# Patient Record
Sex: Male | Born: 1940 | Race: White | Hispanic: No | Marital: Married | Smoking: Never smoker
Health system: Southern US, Community
[De-identification: ages and names within clinical notes are randomized; demographics above are authoritative.]

## PROBLEM LIST (undated history)

## (undated) DIAGNOSIS — M1990 Unspecified osteoarthritis, unspecified site: Secondary | ICD-10-CM

## (undated) DIAGNOSIS — Z8679 Personal history of other diseases of the circulatory system: Secondary | ICD-10-CM

## (undated) DIAGNOSIS — Z950 Presence of cardiac pacemaker: Secondary | ICD-10-CM

## (undated) DIAGNOSIS — S069X9A Unspecified intracranial injury with loss of consciousness of unspecified duration, initial encounter: Secondary | ICD-10-CM

## (undated) DIAGNOSIS — I10 Essential (primary) hypertension: Secondary | ICD-10-CM

## (undated) DIAGNOSIS — E7800 Pure hypercholesterolemia, unspecified: Secondary | ICD-10-CM

## (undated) HISTORY — PX: NOSE SURGERY: SHX723

## (undated) HISTORY — DX: Unspecified osteoarthritis, unspecified site: M19.90

## (undated) HISTORY — PX: BACK SURGERY: SHX140

---

## 1962-10-01 DIAGNOSIS — S069X9A Unspecified intracranial injury with loss of consciousness of unspecified duration, initial encounter: Secondary | ICD-10-CM

## 1962-10-01 DIAGNOSIS — S069XAA Unspecified intracranial injury with loss of consciousness status unknown, initial encounter: Secondary | ICD-10-CM

## 1962-10-01 HISTORY — DX: Unspecified intracranial injury with loss of consciousness status unknown, initial encounter: S06.9XAA

## 1962-10-01 HISTORY — DX: Unspecified intracranial injury with loss of consciousness of unspecified duration, initial encounter: S06.9X9A

## 2001-10-16 ENCOUNTER — Encounter: Admission: RE | Admit: 2001-10-16 | Discharge: 2002-01-14 | Payer: Self-pay | Admitting: Internal Medicine

## 2002-03-27 ENCOUNTER — Ambulatory Visit (HOSPITAL_COMMUNITY): Admission: RE | Admit: 2002-03-27 | Discharge: 2002-03-27 | Payer: Self-pay | Admitting: Internal Medicine

## 2003-01-04 ENCOUNTER — Ambulatory Visit (HOSPITAL_COMMUNITY): Admission: RE | Admit: 2003-01-04 | Discharge: 2003-01-04 | Payer: Self-pay | Admitting: Gastroenterology

## 2007-03-21 ENCOUNTER — Encounter: Admission: RE | Admit: 2007-03-21 | Discharge: 2007-03-21 | Payer: Self-pay | Admitting: Internal Medicine

## 2007-03-23 ENCOUNTER — Encounter: Admission: RE | Admit: 2007-03-23 | Discharge: 2007-03-23 | Payer: Self-pay | Admitting: Internal Medicine

## 2007-03-29 ENCOUNTER — Ambulatory Visit (HOSPITAL_COMMUNITY): Admission: RE | Admit: 2007-03-29 | Discharge: 2007-03-29 | Payer: Self-pay | Admitting: Neurosurgery

## 2007-04-01 ENCOUNTER — Emergency Department (HOSPITAL_COMMUNITY): Admission: EM | Admit: 2007-04-01 | Discharge: 2007-04-01 | Payer: Self-pay | Admitting: Emergency Medicine

## 2007-06-15 ENCOUNTER — Observation Stay (HOSPITAL_COMMUNITY): Admission: EM | Admit: 2007-06-15 | Discharge: 2007-06-15 | Payer: Self-pay | Admitting: Emergency Medicine

## 2007-06-15 ENCOUNTER — Ambulatory Visit: Payer: Self-pay | Admitting: *Deleted

## 2007-08-03 ENCOUNTER — Emergency Department (HOSPITAL_COMMUNITY): Admission: EM | Admit: 2007-08-03 | Discharge: 2007-08-03 | Payer: Self-pay | Admitting: Family Medicine

## 2009-07-06 ENCOUNTER — Encounter: Admission: RE | Admit: 2009-07-06 | Discharge: 2009-07-06 | Payer: Self-pay | Admitting: Orthopedic Surgery

## 2009-07-09 ENCOUNTER — Ambulatory Visit (HOSPITAL_COMMUNITY): Admission: RE | Admit: 2009-07-09 | Discharge: 2009-07-10 | Payer: Self-pay | Admitting: Neurosurgery

## 2010-03-16 ENCOUNTER — Emergency Department (HOSPITAL_COMMUNITY): Admission: EM | Admit: 2010-03-16 | Discharge: 2010-03-16 | Payer: Self-pay | Admitting: Emergency Medicine

## 2010-08-30 ENCOUNTER — Emergency Department (HOSPITAL_COMMUNITY)
Admission: EM | Admit: 2010-08-30 | Discharge: 2010-08-30 | Payer: Self-pay | Source: Home / Self Care | Admitting: Family Medicine

## 2010-10-22 ENCOUNTER — Encounter: Payer: Self-pay | Admitting: Internal Medicine

## 2011-01-04 LAB — CBC
MCHC: 34 g/dL (ref 30.0–36.0)
MCV: 94.2 fL (ref 78.0–100.0)
RBC: 4.57 MIL/uL (ref 4.22–5.81)
RDW: 13.2 % (ref 11.5–15.5)

## 2011-01-04 LAB — BASIC METABOLIC PANEL
BUN: 15 mg/dL (ref 6–23)
CO2: 26 mEq/L (ref 19–32)
Calcium: 9.2 mg/dL (ref 8.4–10.5)
Chloride: 105 mEq/L (ref 96–112)
Creatinine, Ser: 0.87 mg/dL (ref 0.4–1.5)
Glucose, Bld: 124 mg/dL — ABNORMAL HIGH (ref 70–99)

## 2011-02-13 NOTE — H&P (Signed)
NAMECHRISTION, Marvin Padilla NO.:  [id number]   MEDICAL RECORD NO.:  [id number]          PATIENT TYPE:  INP   LOCATION:  2019                         FACILITY:  [facility]   PHYSICIAN:  Marvin Amel, MD    DATE OF BIRTH:  Feb 19, 1941   DATE OF ADMISSION:  06/14/2007  DATE OF DISCHARGE:                              HISTORY & PHYSICAL   PRIMARY CARE PHYSICIAN:  Marvin Padilla, M.D.   CHIEF COMPLAINT:  Chest pressure.   HISTORY OF PRESENT ILLNESS:  The patient is a 70 year old white male  with past medical history notable for hypertension, hyperlipidemia, who  presents to the emergency department for further evaluation of chest  pressure.  The patient states his symptoms began approximately 8 p.m.  this evening after working in the yard.  He characterized his chest  pressure localized over his sternum.  He denies any radiation to the  arm, neck, jaw or back.  He does endorse associated diaphoresis;  however, denies any shortness of breath, dyspnea on exertion, or  palpitations.  The patient does note that he had an episode of  indigestion several days ago which was similar in nature; however, this  episode this evening was much more severe.  On arrival to the emergency  department, the patient was initiated on a nitroglycerin drip which did  resolve his symptoms.  His EKG revealed a left bundle branch block and  on comparison with prior studies this is unchanged.  He had 2 sets of  point-of-care biomarkers drawn which were negative.  At the time of my  initial evaluation, the patient was chest pain free and otherwise  without complaints.   PAST MEDICAL HISTORY:  1. Hypertension.  2. Hyperlipidemia.  3. Status post back surgery in June 2008 for herniated disk.   ALLERGIES:  No known drug allergies.   CURRENT MEDICATIONS:  1. HCTZ 25 mg daily.  2. Crestor 10 mg daily.   SOCIAL HISTORY:  The patient lives in [place] with his wife.  He  is a retired Engineer, maintenance.  He denies tobacco, alcohol or  illicit substance.   FAMILY HISTORY:  His mother died secondary to myocardial infarction and  heart failure at the age of 34.  His father died at the age of 3  secondary to CAD and heart failure.  He has 1 sister who passed away  from ovarian cancer.   REVIEW OF SYSTEMS:  As per HPI.  Otherwise, complete review of systems  is negative.   PHYSICAL EXAMINATION:  VITAL SIGNS:  Blood pressure is 117/63.  Heart  rate is 69.  O2 sats are 96% on room air.  GENERAL:  The patient is alert and oriented times 3, in no acute  distress, pleasantly conversant.  HEENT:  Normocephalic, atraumatic.  EOMI.  PERRL.  Nares pink.  OP clear  without erythema or exudate.  NECK:  Supple, full range of motion, no JVD.  There is no palpable  thyromegaly or lymphadenopathy.  His carotid upstrokes are equal and  symmetric bilaterally with no audible bruits.  CARDIOVASCULAR:  Normal S1  and S2 with no audible murmurs, rubs or  gallops.  His PMI is nondisplaced in the left midclavicular line.  His  peripheral pulse are 2+ symmetric bilaterally.  LUNGS:  Clear to auscultation bilaterally.  SKIN:  No rashes or lesions.  ABDOMEN:  Soft, nontender, nondistended.  Positive bowel sounds.  No  hepatosplenomegaly.  GU:  Normal male genitalia.  EXTREMITIES:  Reveal no clubbing, cyanosis or edema.  There is no rashes  or inflammation noted.  MUSCULOSKELETAL:  No joint deformity or effusions.  NEUROLOGIC:  Strength and sensation are grossly intact throughout.  Otherwise, exam is nonfocal.   Chest x-ray:  No acute cardiopulmonary process.   EKG:  Normal sinus rhythm with left bundle branch block.   LABS:  White blood cell count 15.4.  Hematocrit 43.7.  Platelet count  202.  Sodium 136.  Potassium 3.7.  Chloride 96.  CO2 is 33.  Glucose  138.  BUN is 10.  Creatinine 0.74.  Point-of-care cardiac biomarkers  negative times 3.   IMPRESSION:  1. Chest pain, rule out  myocardial infarction.  2. Left bundle branch block.  3. Hypertension.  4. Hyperlipidemia.  5. Status post back surgery for herniated disk.   PLAN:  From a cardiovascular standpoint, [name] is stable and his  symptoms have resolved.  We will plan to admit the patient to a  telemetry bed to rule out myocardial infarction.  We will cycle his  cardiac biomarkers.  On my examination, there is no evidence of heart  failure, and his chest x-ray reveals no evidence of pulmonary edema.  His EKG reveals left bundle branch block which makes it somewhat  difficult to interpret for ischemia; however, this is unchanged compared  with his prior studies.  His initial point-of-care biomarkers are  negative times 3.  His symptoms are concerning for angina; however, I do  feel that an acute coronary syndrome is unlikely.  His blood pressure  was elevated on arrival; however, this has come under excellent control  with the nitroglycerin drip at 10 mcg.  He takes hydrochlorothiazide at  home.  We will continue with this therapy throughout this  hospitalization.  We will wean to nitroglycerin drip off and provide  sublingual nitroglycerin as needed.  The patient will likely require an  additional antihypertensive to achieve his goal blood pressure.  He is  currently taking 10 mg of Crestor for hyperlipidemia.  We will check an  a.m. fasting lipid profile.  The patient will likely require additional  risk stratification which may include cardiac catheterization versus  noninvasive stress testing.  Of note, the patient did have a low-grade  temp on presentation, and his white blood cell count is 15.4.  There is  no evidence of infiltrate on his chest x-ray.  He currently is  experiencing no localizing symptoms of infection.      Marvin Amel, MD  Electronically Signed    SHG/MEDQ  D:  06/15/2007  T:  06/15/2007  Job:  [id number]

## 2011-02-13 NOTE — Discharge Summary (Signed)
NAMETKAI, SERFASS NO.:  [id number]   MEDICAL RECORD NO.:  [id number]          PATIENT TYPE:  INP   LOCATION:  2019                         FACILITY:  [facility]   PHYSICIAN:  Jake Bathe, MD      DATE OF BIRTH:  Aug 18, 1941   DATE OF ADMISSION:  06/14/2007  DATE OF DISCHARGE:                               DISCHARGE SUMMARY   PRIMARY CARE PHYSICIAN:  Dr. Elmore Guise   CARDIOLOGIST:  Dr. [facility]   FINAL DIAGNOSES:  1. Chest pain.  2. Hypertension.  3. Hyperlipidemia.  4. Back surgery.  5. Herniated disc in June of 2008.   PROCEDURES:  Chest x-ray within normal limits.   HOSPITAL COURSE:  Mr. 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  He denied any radiation.  Tried to go to sleep, however pain persisted  and; therefore, went to [facility] Emergency Department.   He has a longstanding left bundle-branch block which was picked up on an  insurance physical a few years ago.  He has been medically compliant  with his blood pressure meds.  His wife has had three back surgeries; he  has had one.   He was placed on a telemetry floor and cardiac enzymes were cycled all  of which were within normal limits.   VITAL SIGNS ON DISCHARGE:  Blood pressure 145/86, heart rate 64,  respiratory rate 18, temperature 99.4.   PHYSICAL EXAMINATION:  GENERAL:  Alert and oriented x3, no acute  distress.  CARDIOVASCULAR:  Regular rate and rhythm.  No murmurs, rubs or gallops.  LUNGS:  Clear to auscultation bilaterally.  ABDOMEN:  Soft, nontender.  Normoactive bowel sounds.  EXTREMITIES:  No clubbing, cyanosis or edema.  NEUROLOGICAL:  Nonfocal.   DATA:  EKG  demonstrates left bundle-branch block, sinus rhythm, rate 73.   BNP was 65.  Total cholesterol 163, LDL 87, HDL 59, triglycerides 86.  Chemistry:  Creatinine 0.85, blood glucose 138 and 163.  CBC:  Noteworthy for white blood cell count on arrival of 15.4 then decreasing  to 14.5.   DISCHARGE MEDICATIONS:  1. Aspirin 81 mg p.o. q.day.  2. Crestor 10 mg p.o. q.day.  3. Hydrochlorothiazide 25 mg p.o. q.day.  4. Colace 100 mg p.o. q.day.  5. Nitroglycerin 0.4 mg sublingual p.r.n.   FOLLOWUP:  We will schedule return visit with me and outpatient stress  test as soon as possible.  I have instructed him to return to the  emergency department or call 911 if any further worrisome symptoms  occur.   Keep in mind slightly elevated blood glucose, would monitor as  outpatient for possible diabetes.      Jake Bathe, MD  Electronically Signed     MCS/MEDQ  D:  06/15/2007  T:  06/15/2007  Job:  [id number]  cc:   Lance Muss, M.D.

## 2011-02-13 NOTE — Op Note (Signed)
[name], Marvin Padilla              ACCOUNT NO.:  [id number]   MEDICAL RECORD NO.:  [id number]          PATIENT TYPE:  AMB   LOCATION:  SDS                          FACILITY:  [facility]   PHYSICIAN:  Coletta Memos, M.D.     DATE OF BIRTH:  [birth year]   DATE OF PROCEDURE:  03/29/2007  DATE OF DISCHARGE:                               OPERATIVE REPORT   PREOPERATIVE DIAGNOSIS:  1. Right displaced disc L4-L5.  2. Right L4 and right L5 radiculopathy.   POSTOPERATIVE DIAGNOSIS:  1. Right displaced disc L4-L5.  2. Right L4 and right L5 radiculopathy.   PROCEDURE:  Right L4-L5 semi-hemilaminectomy and discectomy with  microdissection.   SURGEON:  Coletta Memos, M.D.   ASSISTANT:  Cristi Loron, M.D.   COMPLICATIONS:  None.   ANESTHESIA:  General endotracheal.   INDICATIONS:  [name] presented with significant pain in the  right lower extremity which he has had now for approximately three  weeks.  The pain is unrelenting.  He had an MRI which showed a large  fragment of disc which had migrated rostrally behind the body of L4 and  a large amount of disc at the disc space.  I, therefore, offered and he  agreed to undergo operative decompression.   OPERATIVE NOTE:  [name] was brought to the operating room,  intubated, and placed under a general anesthetic without difficulty.  He  was rolled prone onto a Wilson frame and all pressure points were  properly padded.  His back was prepped and he was draped in a sterile  fashion.  I infiltrated 10 mL 0.5% lidocaine with 1:200,000 epinephrine  into the lumbar region.  I opened the skin with a #10 blade.  I took  this down to the thoracolumbar fascia sharply.  I exposed the lamina and  was L4.  I was in the L4-L5 interlaminar space.  I then placed a self-  retaining retractor.  I used a high speed drill to perform a semi-  hemilaminectomy of L4 and used a curet to remove the ligamentum flavum  to expose the thecal sac and  disc space.  I retract the thecal sac  medially and then was able to appreciate what was disc material that had  come out of the disc space.  I removed that in a piecemeal fashion, but  removing in addition, very large pieces along with it.  Because the disc  space was so weak, I brought the microscope into the operative field to  aid with microdissection.  With Dr. Lovell Sheehan' assistance, we also opened  the disc space and removed disc material from there.  The disc was  extraordinarily degenerated, very soft, and clearly incompetent.  But I  did not do significant scraping of the endplates at this time.  The  nerve root was very  well decompressed, both the L4 and L5 roots.  I then irrigated the  wound.  With Dr. Lovell Sheehan' assistance, we closed in layered fashion using  Vicryl sutures to reapproximate the thoracolumbar fascia and  subcuticular layers.  Dermabond was used for a sterile  dressing.           ______________________________  Coletta Memos, M.D.     KC/MEDQ  D:  03/29/2007  T:  03/29/2007  Job:  [id number]

## 2011-02-16 NOTE — Op Note (Signed)
   NAME:  Marvin Padilla, Marvin Padilla                        ACCOUNT NO.:  [id number]   MEDICAL RECORD NO.:  [id number]                   PATIENT TYPE:  AMB   LOCATION:  ENDO                                 FACILITY:  [facility]   PHYSICIAN:  Danise Edge, M.D.                DATE OF BIRTH:  04-Aug-1941   DATE OF PROCEDURE:  01/04/2003  DATE OF DISCHARGE:                                 OPERATIVE REPORT   PROCEDURE:  Colonoscopy.   INDICATIONS FOR PROCEDURE:  Marvin Padilla is a 70 year old male born  1941/05/28. Marvin Padilla underwent his health maintenance flexible  proctosigmoidoscopy performed by Dr. Elmore Guise; at approximately 10 cm from  the anal verge, a small colon polyp was encountered.   ENDOSCOPIST:  Charolett Bumpers, M.D.   PREMEDICATION:  Versed 7.5 mg, Demerol 50 mg .   ENDOSCOPE:  Olympus adult colonoscope.   DESCRIPTION OF PROCEDURE:  After obtaining informed consent, Marvin Padilla was  placed in the left lateral decubitus position. I administered intravenous  Demerol and intravenous Versed to achieve conscious sedation for the  procedure. The patient's blood pressure, oxygen saturation and cardiac  rhythm were monitored throughout the procedure and documented in the medical  record.   Anal inspection was normal. Digital rectal exam was normal. The Olympus  adult colonoscope was introduced into the rectum and easily advanced to the  cecum. A normal appearing ileocecal valve was intubated and the distal ileum  inspected.  Colonic preparation for the exam today was excellent.   RECTUM:  Normal.  Retroflexed view of the distal rectum normal. Close  inspection does not reveal any rectal polyps by exam today.   SIGMOID COLON AND DESCENDING COLON:  Normal.   SPLENIC FLEXURE:  Normal.   TRANSVERSE COLON:  Normal.   HEPATIC FLEXURE:  Normal.   ASCENDING COLON:  Normal.   CECUM AND ILEOCECAL VALVE:  Normal.   DISTAL ILEUM:  Normal.    ASSESSMENT:  Normal  proctocolonoscopy to the cecum with distal ileal  inspection. No endoscopic evidence for the presence of colorectal neoplasia.                                               Danise Edge, M.D.    MJ/MEDQ  D:  01/04/2003  T:  01/04/2003  Job:  [id number]   cc:   Erskine Speed, M.D.  [address]  [place]  [place] [address]  Fax: [phone]

## 2011-04-20 ENCOUNTER — Inpatient Hospital Stay (INDEPENDENT_AMBULATORY_CARE_PROVIDER_SITE_OTHER)
Admission: RE | Admit: 2011-04-20 | Discharge: 2011-04-20 | Disposition: A | Discharge status: Home / Self Care | Payer: Medicare Other | Source: Ambulatory Visit | Attending: Family Medicine | Admitting: Family Medicine

## 2011-04-20 DIAGNOSIS — T148 Other injury of unspecified body region: Secondary | ICD-10-CM

## 2011-07-13 LAB — BASIC METABOLIC PANEL
BUN: 11
CO2: 25
CO2: 33 — ABNORMAL HIGH
Calcium: 8.7
Chloride: 97
Creatinine, Ser: 0.74
Creatinine, Ser: 0.85
GFR calc Af Amer: >60
GFR calc non Af Amer: >60
Glucose, Bld: 138 — ABNORMAL HIGH
Glucose, Bld: 163 — ABNORMAL HIGH
Potassium: 4
Sodium: 136

## 2011-07-13 LAB — CBC
HCT: 40.6
Hemoglobin: 14.9
MCHC: 34
MCHC: 34
MCV: 93.2
Platelets: 200
RBC: 4.69
RDW: 13
RDW: 13.1

## 2011-07-13 LAB — B-NATRIURETIC PEPTIDE (CONVERTED LAB): Pro B Natriuretic peptide (BNP): 65

## 2011-07-13 LAB — CK TOTAL AND CKMB (NOT AT ARMC): Relative Index: INVALID

## 2011-07-13 LAB — POCT CARDIAC MARKERS
CKMB, poc: 1.1
Myoglobin, poc: 85.8
Myoglobin, poc: 99.8
Operator id: 192351
Operator id: 192351
Troponin i, poc: <0.05

## 2011-07-13 LAB — LIPID PANEL
Cholesterol: 163
HDL: 59
LDL Cholesterol: 87
Total CHOL/HDL Ratio: 2.8
Triglycerides: 86

## 2011-07-18 LAB — BASIC METABOLIC PANEL
CO2: 30
Calcium: 9.2
GFR calc Af Amer: >60
Glucose, Bld: 105 — ABNORMAL HIGH
Potassium: 3.8
Sodium: 137

## 2011-07-18 LAB — CBC
HCT: 44.5
Hemoglobin: 15.1
MCHC: 33.9
RBC: 5.01
RDW: 13.4

## 2013-10-22 ENCOUNTER — Emergency Department (HOSPITAL_COMMUNITY)
Admission: EM | Admit: 2013-10-22 | Discharge: 2013-10-22 | Disposition: A | Discharge status: Home / Self Care | Payer: Medicare Other | Source: Home / Self Care

## 2013-10-22 ENCOUNTER — Encounter (HOSPITAL_COMMUNITY): Payer: Self-pay | Admitting: Emergency Medicine

## 2013-10-22 DIAGNOSIS — R059 Cough, unspecified: Secondary | ICD-10-CM

## 2013-10-22 DIAGNOSIS — R0982 Postnasal drip: Secondary | ICD-10-CM

## 2013-10-22 DIAGNOSIS — R05 Cough: Secondary | ICD-10-CM

## 2013-10-22 HISTORY — DX: Essential (primary) hypertension: I10

## 2013-10-22 HISTORY — DX: Pure hypercholesterolemia, unspecified: E78.00

## 2013-10-22 NOTE — ED Provider Notes (Signed)
Medical screening examination/treatment/procedure(s) were performed by non-physician practitioner and as supervising physician I was immediately available for consultation/collaboration.  Kimberlyn Quiocho, M.D.   Adalid Beckmann C Demita Tobia, MD 10/22/13 1811 

## 2013-10-22 NOTE — ED Notes (Signed)
Congestion, chest congestion, non-productive cough, feels of rattling in chest, denies fever, no ear pain, no sore throat.  No nausea, no vomiting, no diarrhea

## 2013-10-22 NOTE — ED Provider Notes (Signed)
CSN: [id number]     Arrival date & time 10/22/13  1157 History   First MD Initiated Contact with Patient 10/22/13 1437     Chief Complaint  Patient presents with  . URI   (Consider location/radiation/quality/duration/timing/severity/associated sxs/prior Treatment) HPI Comments: Pleasant 73 year old male is complaining of chest congestion that is worse in the morning, a rattle in the upper mid chest, cough and PND for the past 2-3 days. Denies earache, fever, sore throat or shortness of breath.   Past Medical History  Diagnosis Date  . Hypertension   . High cholesterol    Past Surgical History  Procedure Laterality Date  . Back surgery    . Nose surgery     No family history on file. History  Substance Use Topics  . Smoking status: Not on file  . Smokeless tobacco: Not on file  . Alcohol Use: Not on file    Review of Systems  Constitutional: Negative for fever, diaphoresis, activity change and fatigue.  HENT: Positive for postnasal drip, rhinorrhea and trouble swallowing. Negative for ear pain, facial swelling and sore throat.   Eyes: Negative for pain, discharge and redness.  Respiratory: Positive for cough. Negative for chest tightness, shortness of breath and wheezing.   Cardiovascular: Negative.   Gastrointestinal: Negative.   Musculoskeletal: Negative.  Negative for neck pain and neck stiffness.  Neurological: Negative.     Allergies  Review of patient's allergies indicates no known allergies.  Home Medications   Current Outpatient Rx  Name  Route  Sig  Dispense  Refill  . chlorpheniramine-HYDROcodone (TUSSIONEX PENNKINETIC ER) 10-8 MG/5ML LQCR   Oral   Take 5 mLs by mouth.         Marland Kitchen. lisinopril-hydrochlorothiazide (PRINZIDE,ZESTORETIC) 20-25 MG per tablet   Oral   Take 1 tablet by mouth daily.         . Pseudoeph-Doxylamine-DM-APAP (NYQUIL PO)   Oral   Take by mouth.         Marland Kitchen. SIMVASTATIN PO   Oral   Take by mouth.          BP 153/83   Pulse 69  Temp(Src) 98.9 F (37.2 C) (Oral)  Resp 20  SpO2 98% Physical Exam  Nursing note and vitals reviewed. Constitutional: He is oriented to person, place, and time. He appears well-developed and well-nourished. No distress.  HENT:  Mouth/Throat: No oropharyngeal exudate.  Bilateral TMs are normal Oropharynx minimal erythema and clear PND.  Eyes: Conjunctivae and EOM are normal.  Neck: Normal range of motion. Neck supple.  Cardiovascular: Normal rate, regular rhythm and normal heart sounds.   Pulmonary/Chest: Effort normal and breath sounds normal. No respiratory distress. He has no wheezes. He has no rales.  No rattling in the chest as her no wheezes, crackles or other adventitious sounds with forced and deep expiration and cough.  Musculoskeletal: Normal range of motion. He exhibits no edema.  Lymphadenopathy:    He has no cervical adenopathy.  Neurological: He is alert and oriented to person, place, and time. He exhibits normal muscle tone. Coordination normal.  Skin: Skin is warm and dry. No rash noted.  Psychiatric: He has a normal mood and affect.    ED Course  Procedures (including critical care time) Labs Review Labs Reviewed - No data to display Imaging Review No results found.    MDM   1. PND (post-nasal drip)   2. Cough       Drink plenty of fluids stay well hydrated Allegra or  Claritin for drainage At nighttime for a stronger medication to inhibit drainage Chlor-Trimeton. For any worsening such as fever, shortness of breath recheck promptly.     Hayden Rasmussen, NP 10/22/13 1459  Hayden Rasmussen, NP 10/22/13 [phone]

## 2013-10-22 NOTE — Discharge Instructions (Signed)
Cough, Adult  A cough is a reflex that helps clear your throat and airways. It can help heal the body or may be a reaction to an irritated airway. A cough may only last 2 or 3 weeks (acute) or may last more than 8 weeks (chronic).  CAUSES Acute cough:  Viral or bacterial infections. Chronic cough:  Infections.  Allergies.  Asthma.  Post-nasal drip.  Smoking.  Heartburn or acid reflux.  Some medicines.  Chronic lung problems (COPD).  Cancer. SYMPTOMS   Cough.  Fever.  Chest pain.  Increased breathing rate.  High-pitched whistling sound when breathing (wheezing).  Colored mucus that you cough up (sputum). TREATMENT   A bacterial cough may be treated with antibiotic medicine.  A viral cough must run its course and will not respond to antibiotics.  Your caregiver may recommend other treatments if you have a chronic cough. HOME CARE INSTRUCTIONS   Only take over-the-counter or prescription medicines for pain, discomfort, or fever as directed by your caregiver. Use cough suppressants only as directed by your caregiver.  Use a cold steam vaporizer or humidifier in your bedroom or home to help loosen secretions.  Sleep in a semi-upright position if your cough is worse at night.  Rest as needed.  Stop smoking if you smoke. SEEK IMMEDIATE MEDICAL CARE IF:   You have pus in your sputum.  Your cough starts to worsen.  You cannot control your cough with suppressants and are losing sleep.  You begin coughing up blood.  You have difficulty breathing.  You develop pain which is getting worse or is uncontrolled with medicine.  You have a fever. MAKE SURE YOU:   Understand these instructions.  Will watch your condition.  Will get help right away if you are not doing well or get worse. Document Released: 03/16/2011 Document Revised: 12/10/2011 Document Reviewed: 03/16/2011 [facility] Patient Information 2014 [place], [place].  Sinusitis Sinusitis is  redness, soreness, and puffiness (inflammation) of the air pockets in the bones of your face (sinuses). The redness, soreness, and puffiness can cause air and mucus to get trapped in your sinuses. This can allow germs to grow and cause an infection. The most frequent cause of sinus congestion is upper respiratory infection (commonc cold) producing mucous that drains downward against the back of the throat and causes cough, soreness of throat and secretions. This drainage often collects in the throat and trachea (wind pipe) causing increased cough, often worse after lying down for a period of time. HOME CARE   Drink enough fluids to keep your pee (urine) clear or pale yellow.  Use a humidifier in your home.  Run a hot shower to create steam in the bathroom. Sit in the bathroom with the door closed. Breathe in the steam 3 4 times a day.  Put a warm, moist washcloth on your face 3 4 times a day, or as told by your doctor.  Use salt water sprays (saline sprays) to wet the thick fluid in your nose. This can help the sinuses drain.  Only take medicine as told by your doctor. GET HELP RIGHT AWAY IF:   Your pain gets worse.  You have very bad headaches.  You are sick to your stomach (nauseous).  You throw up (vomit).  You are very sleepy (drowsy) all the time.  Your face is puffy (swollen).  Your vision changes.  You have a stiff neck.  You have trouble breathing. MAKE SURE YOU:   Understand these instructions.  Will watch  your condition.  Will get help right away if you are not doing well or get worse. Document Released: 03/05/2008 Document Revised: 06/11/2012 Document Reviewed: 04/22/2012 [facility] Patient Information 2014 [place]  Upper Respiratory Infection, Adult Nondrowsy: allegra 180 mg or claritin 10 mg Drowsy: chlor trimeton 4 mg, a little stronger. An upper respiratory infection (URI) is also sometimes known as the common cold. The upper respiratory tract  includes the nose, sinuses, throat, trachea, and bronchi. Bronchi are the airways leading to the lungs. Most people improve within 1 week, but symptoms can last up to 2 weeks. A residual cough may last even longer.  CAUSES Many different viruses can infect the tissues lining the upper respiratory tract. The tissues become irritated and inflamed and often become very moist. Mucus production is also common. A cold is contagious. You can easily spread the virus to others by oral contact. This includes kissing, sharing a glass, coughing, or sneezing. Touching your mouth or nose and then touching a surface, which is then touched by another person, can also spread the virus. SYMPTOMS  Symptoms typically develop 1 to 3 days after you come in contact with a cold virus. Symptoms vary from person to person. They may include:  Runny nose.  Sneezing.  Nasal congestion.  Sinus irritation.  Sore throat.  Loss of voice (laryngitis).  Cough.  Fatigue.  Muscle aches.  Loss of appetite.  Headache.  Low-grade fever. DIAGNOSIS  You might diagnose your own cold based on familiar symptoms, since most people get a cold 2 to 3 times a year. Your caregiver can confirm this based on your exam. Most importantly, your caregiver can check that your symptoms are not due to another disease such as strep throat, sinusitis, pneumonia, asthma, or epiglottitis. Blood tests, throat tests, and X-rays are not necessary to diagnose a common cold, but they may sometimes be helpful in excluding other more serious diseases. Your caregiver will decide if any further tests are required. RISKS AND COMPLICATIONS  You may be at risk for a more severe case of the common cold if you smoke cigarettes, have chronic heart disease (such as heart failure) or lung disease (such as asthma), or if you have a weakened immune system. The very young and very old are also at risk for more serious infections. Bacterial sinusitis, middle ear  infections, and bacterial pneumonia can complicate the common cold. The common cold can worsen asthma and chronic obstructive pulmonary disease (COPD). Sometimes, these complications can require emergency medical care and may be life-threatening. PREVENTION  The best way to protect against getting a cold is to practice good hygiene. Avoid oral or hand contact with people with cold symptoms. Wash your hands often if contact occurs. There is no clear evidence that vitamin C, vitamin E, echinacea, or exercise reduces the chance of developing a cold. However, it is always recommended to get plenty of rest and practice good nutrition. TREATMENT  Treatment is directed at relieving symptoms. There is no cure. Antibiotics are not effective, because the infection is caused by a virus, not by bacteria. Treatment may include:  Increased fluid intake. Sports drinks offer valuable electrolytes, sugars, and fluids.  Breathing heated mist or steam (vaporizer or shower).  Eating chicken soup or other clear broths, and maintaining good nutrition.  Getting plenty of rest.  Using gargles or lozenges for comfort.  Controlling fevers with ibuprofen or acetaminophen as directed by your caregiver.  Increasing usage of your inhaler if you have asthma. Zinc  gel and zinc lozenges, taken in the first 24 hours of the common cold, can shorten the duration and lessen the severity of symptoms. Pain medicines may help with fever, muscle aches, and throat pain. A variety of non-prescription medicines are available to treat congestion and runny nose. Your caregiver can make recommendations and may suggest nasal or lung inhalers for other symptoms.  HOME CARE INSTRUCTIONS   Only take over-the-counter or prescription medicines for pain, discomfort, or fever as directed by your caregiver.  Use a warm mist humidifier or inhale steam from a shower to increase air moisture. This may keep secretions moist and make it easier to  breathe.  Drink enough water and fluids to keep your urine clear or pale yellow.  Rest as needed.  Return to work when your temperature has returned to normal or as your caregiver advises. You may need to stay home longer to avoid infecting others. You can also use a face mask and careful hand washing to prevent spread of the virus. SEEK MEDICAL CARE IF:   After the first few days, you feel you are getting worse rather than better.  You need your caregiver's advice about medicines to control symptoms.  You develop chills, worsening shortness of breath, or brown or red sputum. These may be signs of pneumonia.  You develop yellow or brown nasal discharge or pain in the face, especially when you bend forward. These may be signs of sinusitis.  You develop a fever, swollen neck glands, pain with swallowing, or white areas in the back of your throat. These may be signs of strep throat. SEEK IMMEDIATE MEDICAL CARE IF:   You have a fever.  You develop severe or persistent headache, ear pain, sinus pain, or chest pain.  You develop wheezing, a prolonged cough, cough up blood, or have a change in your usual mucus (if you have chronic lung disease).  You develop sore muscles or a stiff neck. Document Released: 03/13/2001 Document Revised: 12/10/2011 Document Reviewed: 01/19/2011 [facility] Patient Information 2014 [place], [place].

## 2014-11-09 DIAGNOSIS — L309 Dermatitis, unspecified: Secondary | ICD-10-CM | POA: Diagnosis not present

## 2015-02-07 DIAGNOSIS — L219 Seborrheic dermatitis, unspecified: Secondary | ICD-10-CM | POA: Diagnosis not present

## 2015-02-07 DIAGNOSIS — L570 Actinic keratosis: Secondary | ICD-10-CM | POA: Diagnosis not present

## 2015-03-09 DIAGNOSIS — H25813 Combined forms of age-related cataract, bilateral: Secondary | ICD-10-CM | POA: Diagnosis not present

## 2015-03-17 DIAGNOSIS — E780 Pure hypercholesterolemia: Secondary | ICD-10-CM | POA: Diagnosis not present

## 2015-03-17 DIAGNOSIS — D559 Anemia due to enzyme disorder, unspecified: Secondary | ICD-10-CM | POA: Diagnosis not present

## 2015-03-17 DIAGNOSIS — Z125 Encounter for screening for malignant neoplasm of prostate: Secondary | ICD-10-CM | POA: Diagnosis not present

## 2015-03-17 DIAGNOSIS — I10 Essential (primary) hypertension: Secondary | ICD-10-CM | POA: Diagnosis not present

## 2015-03-18 DIAGNOSIS — Z0000 Encounter for general adult medical examination without abnormal findings: Secondary | ICD-10-CM | POA: Diagnosis not present

## 2015-03-18 DIAGNOSIS — I10 Essential (primary) hypertension: Secondary | ICD-10-CM | POA: Diagnosis not present

## 2015-03-18 DIAGNOSIS — E780 Pure hypercholesterolemia: Secondary | ICD-10-CM | POA: Diagnosis not present

## 2015-03-29 DIAGNOSIS — M62830 Muscle spasm of back: Secondary | ICD-10-CM | POA: Diagnosis not present

## 2015-04-30 ENCOUNTER — Emergency Department (HOSPITAL_COMMUNITY): Payer: Medicare Other

## 2015-04-30 ENCOUNTER — Observation Stay (HOSPITAL_COMMUNITY): Payer: Medicare Other

## 2015-04-30 ENCOUNTER — Observation Stay (HOSPITAL_COMMUNITY)
Admission: EM | Admit: 2015-04-30 | Discharge: 2015-05-02 | Disposition: A | Discharge status: Home / Self Care | Payer: Medicare Other | Attending: Internal Medicine | Admitting: Internal Medicine

## 2015-04-30 ENCOUNTER — Encounter (HOSPITAL_COMMUNITY): Payer: Self-pay | Admitting: Emergency Medicine

## 2015-04-30 DIAGNOSIS — R55 Syncope and collapse: Principal | ICD-10-CM | POA: Diagnosis present

## 2015-04-30 DIAGNOSIS — I10 Essential (primary) hypertension: Secondary | ICD-10-CM | POA: Insufficient documentation

## 2015-04-30 DIAGNOSIS — R569 Unspecified convulsions: Secondary | ICD-10-CM | POA: Diagnosis not present

## 2015-04-30 DIAGNOSIS — I951 Orthostatic hypotension: Secondary | ICD-10-CM | POA: Diagnosis present

## 2015-04-30 DIAGNOSIS — I447 Left bundle-branch block, unspecified: Secondary | ICD-10-CM

## 2015-04-30 DIAGNOSIS — Z87820 Personal history of traumatic brain injury: Secondary | ICD-10-CM | POA: Diagnosis not present

## 2015-04-30 DIAGNOSIS — S069XAA Unspecified intracranial injury with loss of consciousness status unknown, initial encounter: Secondary | ICD-10-CM | POA: Diagnosis present

## 2015-04-30 DIAGNOSIS — E7800 Pure hypercholesterolemia, unspecified: Secondary | ICD-10-CM

## 2015-04-30 DIAGNOSIS — R001 Bradycardia, unspecified: Secondary | ICD-10-CM | POA: Insufficient documentation

## 2015-04-30 DIAGNOSIS — E785 Hyperlipidemia, unspecified: Secondary | ICD-10-CM | POA: Insufficient documentation

## 2015-04-30 DIAGNOSIS — R4020 Unspecified coma: Secondary | ICD-10-CM | POA: Diagnosis not present

## 2015-04-30 DIAGNOSIS — R5601 Complex febrile convulsions: Secondary | ICD-10-CM | POA: Diagnosis not present

## 2015-04-30 DIAGNOSIS — S069X9A Unspecified intracranial injury with loss of consciousness of unspecified duration, initial encounter: Secondary | ICD-10-CM | POA: Diagnosis present

## 2015-04-30 HISTORY — DX: Personal history of other diseases of the circulatory system: Z86.79

## 2015-04-30 HISTORY — DX: Unspecified intracranial injury with loss of consciousness of unspecified duration, initial encounter: S06.9X9A

## 2015-04-30 LAB — URINALYSIS, ROUTINE W REFLEX MICROSCOPIC
Bilirubin Urine: NEGATIVE
Glucose, UA: NEGATIVE mg/dL
HGB URINE DIPSTICK: NEGATIVE
Ketones, ur: NEGATIVE mg/dL
Leukocytes, UA: NEGATIVE
NITRITE: NEGATIVE
PH: 6 (ref 5.0–8.0)
PROTEIN: NEGATIVE mg/dL
SPECIFIC GRAVITY, URINE: 1.01 (ref 1.005–1.030)
UROBILINOGEN UA: 0.2 mg/dL (ref 0.0–1.0)

## 2015-04-30 LAB — CBC WITH DIFFERENTIAL/PLATELET
Basophils Absolute: 0.1 10*3/uL (ref 0.0–0.1)
Basophils Relative: 2 % — ABNORMAL HIGH (ref 0–1)
EOS ABS: 0.1 10*3/uL (ref 0.0–0.7)
EOS PCT: 4 % (ref 0–5)
HEMATOCRIT: 40.8 % (ref 39.0–52.0)
Hemoglobin: 14.2 g/dL (ref 13.0–17.0)
LYMPHS ABS: 1.8 10*3/uL (ref 0.7–4.0)
LYMPHS PCT: 43 % (ref 12–46)
MCH: 31.1 pg (ref 26.0–34.0)
MCHC: 34.8 g/dL (ref 30.0–36.0)
MCV: 89.5 fL (ref 78.0–100.0)
MONO ABS: 0.3 10*3/uL (ref 0.1–1.0)
Monocytes Relative: 7 % (ref 3–12)
Neutro Abs: 1.8 10*3/uL (ref 1.7–7.7)
Neutrophils Relative %: 44 % (ref 43–77)
PLATELETS: 157 10*3/uL (ref 150–400)
RBC: 4.56 MIL/uL (ref 4.22–5.81)
RDW: 12.8 % (ref 11.5–15.5)
WBC: 4 10*3/uL (ref 4.0–10.5)

## 2015-04-30 LAB — TROPONIN I

## 2015-04-30 LAB — BASIC METABOLIC PANEL
ANION GAP: 8 (ref 5–15)
BUN: 15 mg/dL (ref 6–20)
CHLORIDE: 105 mmol/L (ref 101–111)
CO2: 24 mmol/L (ref 22–32)
Calcium: 9.2 mg/dL (ref 8.9–10.3)
Creatinine, Ser: 0.8 mg/dL (ref 0.61–1.24)
Glucose, Bld: 132 mg/dL — ABNORMAL HIGH (ref 65–99)
POTASSIUM: 3.9 mmol/L (ref 3.5–5.1)
SODIUM: 137 mmol/L (ref 135–145)

## 2015-04-30 LAB — MAGNESIUM: Magnesium: 2.3 mg/dL (ref 1.7–2.4)

## 2015-04-30 LAB — I-STAT TROPONIN, ED: TROPONIN I, POC: 0 ng/mL (ref 0.00–0.08)

## 2015-04-30 LAB — TSH: TSH: 1.307 u[IU]/mL (ref 0.350–4.500)

## 2015-04-30 MED ORDER — SIMVASTATIN 40 MG PO TABS
40.0000 mg | ORAL_TABLET | Freq: Every day | ORAL | Status: DC
  Administered 2015-05-01 – 2015-05-02 (×2): 40 mg via ORAL
  Filled 2015-04-30 (×2): qty 1

## 2015-04-30 MED ORDER — ENOXAPARIN SODIUM 40 MG/0.4ML ~~LOC~~ SOLN
40.0000 mg | SUBCUTANEOUS | Status: DC
  Administered 2015-04-30 – 2015-05-02 (×3): 40 mg via SUBCUTANEOUS
  Filled 2015-04-30 (×3): qty 0.4

## 2015-04-30 MED ORDER — ONDANSETRON HCL 4 MG/2ML IJ SOLN: 4.0000 mg | Freq: Four times a day (QID) | INTRAMUSCULAR | Status: DC | PRN

## 2015-04-30 MED ORDER — SODIUM CHLORIDE 0.9 % IJ SOLN
3.0000 mL | Freq: Two times a day (BID) | INTRAMUSCULAR | Status: DC
  Administered 2015-04-30 (×2): 3 mL via INTRAVENOUS

## 2015-04-30 MED ORDER — ADULT MULTIVITAMIN W/MINERALS CH
1.0000 | ORAL_TABLET | Freq: Every day | ORAL | Status: DC
  Administered 2015-05-01 – 2015-05-02 (×2): 1 via ORAL
  Filled 2015-04-30 (×4): qty 1

## 2015-04-30 MED ORDER — SENNOSIDES-DOCUSATE SODIUM 8.6-50 MG PO TABS: 1.0000 | ORAL_TABLET | Freq: Every evening | ORAL | Status: DC | PRN

## 2015-04-30 MED ORDER — ACETAMINOPHEN 325 MG PO TABS
650.0000 mg | ORAL_TABLET | Freq: Four times a day (QID) | ORAL | Status: DC | PRN
  Administered 2015-04-30 – 2015-05-01 (×2): 650 mg via ORAL
  Filled 2015-04-30 (×2): qty 2

## 2015-04-30 MED ORDER — LISINOPRIL 20 MG PO TABS
20.0000 mg | ORAL_TABLET | Freq: Every day | ORAL | Status: DC
  Administered 2015-05-01: 20 mg via ORAL
  Filled 2015-04-30: qty 1

## 2015-04-30 MED ORDER — LISINOPRIL-HYDROCHLOROTHIAZIDE 20-25 MG PO TABS: 1.0000 | ORAL_TABLET | Freq: Every day | ORAL | Status: DC

## 2015-04-30 MED ORDER — ACETAMINOPHEN 650 MG RE SUPP
650.0000 mg | Freq: Four times a day (QID) | RECTAL | Status: DC | PRN
Start: 2015-04-30 — End: 2015-05-02

## 2015-04-30 MED ORDER — LORATADINE 10 MG PO TABS
10.0000 mg | ORAL_TABLET | Freq: Every day | ORAL | Status: DC
  Administered 2015-05-01 – 2015-05-02 (×2): 10 mg via ORAL
  Filled 2015-04-30 (×2): qty 1

## 2015-04-30 MED ORDER — HYDROCHLOROTHIAZIDE 25 MG PO TABS: 25.0000 mg | ORAL_TABLET | Freq: Every day | ORAL | Status: DC

## 2015-04-30 MED ORDER — SODIUM CHLORIDE 0.9 % IV BOLUS (SEPSIS)
500.0000 mL | Freq: Once | INTRAVENOUS | Status: AC
  Administered 2015-04-30: 500 mL via INTRAVENOUS

## 2015-04-30 MED ORDER — DOCUSATE SODIUM 100 MG PO CAPS
100.0000 mg | ORAL_CAPSULE | Freq: Every day | ORAL | Status: DC
  Administered 2015-05-01 – 2015-05-02 (×2): 100 mg via ORAL
  Filled 2015-04-30 (×3): qty 1

## 2015-04-30 MED ORDER — ONDANSETRON HCL 4 MG PO TABS
4.0000 mg | ORAL_TABLET | Freq: Four times a day (QID) | ORAL | Status: DC | PRN
Start: 2015-04-30 — End: 2015-05-02

## 2015-04-30 NOTE — Evaluation (Signed)
Physical Therapy Evaluation Patient Details Name: Marvin Padilla MRN: [id number] DOB: 01-25-1941 Today's Date: 04/30/2015   History of Present Illness  Patient is a 74 yo male admitted 04/30/15 with syncope and poss seizure.  PMH:  HTN, TBI 50 years ago, HLD  Clinical Impression  Patient is functioning at independent level with mobility and gait.  Good balance with high level balance activities.  No acute PT needs identified - PT will sign off.    Follow Up Recommendations No PT follow up;Supervision - Intermittent    Equipment Recommendations  None recommended by PT    Recommendations for Other Services       Precautions / Restrictions Precautions Precautions: None Restrictions Weight Bearing Restrictions: No      Mobility  Bed Mobility Overal bed mobility: Independent                Transfers Overall transfer level: Independent Equipment used: None                Ambulation/Gait Ambulation/Gait assistance: Independent Ambulation Distance (Feet): 250 Feet Assistive device: None Gait Pattern/deviations: WFL(Within Functional Limits);Step-through pattern   Gait velocity interpretation: at or above normal speed for age/gender General Gait Details: Patient with good gait pattern, balance, and speed.  Stairs            Wheelchair Mobility    Modified Rankin (Stroke Patients Only)       Balance Overall balance assessment: Independent                           High level balance activites: Direction changes;Turns;Sudden stops;Head turns (stepping over and around obstacles, speed changes) High Level Balance Comments: No loss of balance with high level balance activities             Pertinent Vitals/Pain Pain Assessment: No/denies pain    Home Living Family/patient expects to be discharged to:: Private residence Living Arrangements: Spouse/significant other;Other relatives (57 yo grandson - high functioning  autism) Available Help at Discharge: Family;Available 24 hours/day Type of Home: House Home Access: Stairs to enter Entrance Stairs-Rails: Doctor, general practice of Steps: 3 Home Layout: One level Home Equipment: Grab bars - tub/shower;Walker - 2 wheels;Cane - single point      Prior Function Level of Independence: Independent         Comments: Very active, yardwork.  Works transporting/driving cars for [employer]        Extremity/Trunk Assessment   Upper Extremity Assessment: Overall WFL for tasks assessed           Lower Extremity Assessment: Overall WFL for tasks assessed      Cervical / Trunk Assessment: Normal  Communication   Communication: No difficulties  Cognition Arousal/Alertness: Awake/alert Behavior During Therapy: WFL for tasks assessed/performed Overall Cognitive Status: Within Functional Limits for tasks assessed                      General Comments      Exercises        Assessment/Plan    PT Assessment Patent does not need any further PT services  PT Diagnosis Abnormality of gait;Generalized weakness   PT Problem List    PT Treatment Interventions     PT Goals (Current goals can be found in the Care Plan section) Acute Rehab PT Goals PT Goal Formulation: All assessment and education complete, DC therapy    Frequency  Barriers to discharge        Co-evaluation               End of Session   Activity Tolerance: Patient tolerated treatment well Patient left: in chair;with call bell/phone within reach;with family/visitor present Nurse Communication: Mobility status    Functional Assessment Tool Used: Clinical judgement Functional Limitation: Mobility: Walking and moving around Mobility: Walking and Moving Around Current Status (Z6109): 0 percent impaired, limited or restricted Mobility: Walking and Moving Around Goal Status [phone]): 0 percent impaired, limited or  restricted Mobility: Walking and Moving Around Discharge Status [phone]): 0 percent impaired, limited or restricted    Time: 1720-1750 PT Time Calculation (min) (ACUTE ONLY): 30 min   Charges:   PT Evaluation $Initial PT Evaluation Tier I: 1 Procedure PT Treatments $Gait Training: 8-22 mins   PT G Codes:   PT G-Codes **NOT FOR INPATIENT CLASS** Functional Assessment Tool Used: Clinical judgement Functional Limitation: Mobility: Walking and moving around Mobility: Walking and Moving Around Current Status (B1478): 0 percent impaired, limited or restricted Mobility: Walking and Moving Around Goal Status (G9562): 0 percent impaired, limited or restricted Mobility: Walking and Moving Around Discharge Status (Z3086): 0 percent impaired, limited or restricted    Vena Austria 04/30/2015, 7:27 PM Durenda Hurt. Renaldo Fiddler, [facility] Pager [phone]

## 2015-04-30 NOTE — ED Notes (Signed)
Attempted report 

## 2015-04-30 NOTE — H&P (Signed)
[facility] History and Physical                                                                                    Marvin Padilla, is a 74 y.o. male  MRN: [id number]   DOB - Apr 08, 1941  Admit Date - 04/30/2015  Outpatient Primary MD for the patient is GREEN, Marvin Bachelor, MD  Referring Physician:  Dr. Regenia Skeeter  Chief Complaint:   Chief Complaint  Patient presents with  . Seizures     HPI  Marvin Padilla  is a 74 y.o. 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 He presents the emergency department with an episode of syncope versus seizure this morning at [facility]. [name] reports that he has been feeling fine lately with no issues of illness at all. He works for a [employer]. Yesterday he was riding in a car for 10 hours. He got up this morning, had a cup of coffee with honey and it and went to [employer] for his [employer] and Bible study. He had a second cup of coffee. He noticed he was feeling badly and that his vision became blurry. He slumped over on the table. He momentarily lost consciousness. When he awoke a friend with EMT training was holding him down. The patient states he felt very very weak.  There was some question of whether he had body shakes after losing consciousness. There was no report of bowel or bladder incontinence. There is no report of tongue biting. The patient reports he has never had syncope or seizures in the past. The patient has had no recent changes in medications.  In the emergency department the patient still feels weak but looks well. Labs are reassuring. Glucose is 132, CBC and be met are normal. Urinalysis is pending. Chest x-ray is clear. CT scan of his head is negative for acute injury or bleed. An EEG is currently in progress.   Review of Systems   In addition to the HPI above,  No Fever-chills, + + He had a slight headache with his loss of  consciousness. No problems swallowing food or Liquids, No Chest pain, Cough or Shortness of Breath, No Abdominal pain, No Nausea or Vomiting, Bowel movements are regular, No Blood in stool or Urine, No dysuria, No new skin rashes or bruises, No new joints pains-aches,  No new weakness, tingling, numbness in any extremity, No recent weight gain or loss, A full 10 point Review of Systems was done, except as stated above, all other Review of Systems were negative.  Past Medical History  Past Medical History  Diagnosis Date  . Hypertension   . High cholesterol   . 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 Back surgery    . Nose surgery        Social History History  Substance Use Topics  . Smoking status: Never Smoker   . Smokeless tobacco: Not on file  . Alcohol Use: No   He lives at home with his  wife. Is independent with ADLs. Has not had alcohol or tobacco since his college years.  Family History His paternal grandfather died with a stroke. Both parents had congestive heart failure.  He knows of no DVTs or PEs in the family.  Prior to Admission medications   Medication Sig Start Date End Date Taking? Authorizing Provider  acetaminophen (TYLENOL) 325 MG tablet Take 650 mg by mouth every evening.   Yes Historical Provider, MD  Docusate Calcium (STOOL SOFTENER PO) Take 1 tablet by mouth daily.   Yes Historical Provider, MD  lisinopril-hydrochlorothiazide (PRINZIDE,ZESTORETIC) 20-25 MG per tablet Take 1 tablet by mouth daily.   Yes Historical Provider, MD  loratadine (CLARITIN) 10 MG tablet Take 10 mg by mouth daily.   Yes Historical Provider, MD  Multiple Vitamins-Minerals (MULTIVITAMIN ADULTS 50+) TABS Take 1 tablet by mouth daily.   Yes Historical Provider, MD  simvastatin (ZOCOR) 40 MG tablet Take 40 mg by mouth daily. 04/02/15  Yes Historical Provider, MD    No Known Allergies  Physical  Exam  Vitals  Blood pressure 147/87, pulse 54, temperature 97.7 F (36.5 C), temperature source Oral, resp. rate 15, height $RemoveBe'5\' 10"'pGssLXirj$  (1.778 m), weight 94.348 kg (208 lb), SpO2 98 %.   General: Well-developed, well-nourished, elderly male lying in bed in NAD, wife at bedside.  Psych:  Normal affect and insight, Not Suicidal or Homicidal, Awake Alert, Oriented X 3.  Neuro:   No F.N deficits, ALL C.Nerves Intact, Strength 5/5 all 4 extremities, Sensation intact all 4 extremities.  ENT:  Ears and Eyes appear Normal, Conjunctivae clear, PER. Moist oral mucosa without erythema or exudates.  Neck:  Supple, No lymphadenopathy appreciated  Respiratory:  Symmetrical chest wall movement, Good air movement bilaterally, CTAB.  Cardiac:  RRR, No Murmurs, no LE edema noted, no JVD.    Abdomen:  Positive bowel sounds, Soft, Non tender, Non distended,  No masses appreciated  Skin:  No Cyanosis, Normal Skin Turgor, No Skin Rash or Bruise.  Extremities:  Able to move all 4. 5/5 strength in each,  no effusions.  Data Review  CBC  Recent Labs Lab 04/30/15 0823  WBC 4.0  HGB 14.2  HCT 40.8  PLT 157  MCV 89.5  MCH 31.1  MCHC 34.8  RDW 12.8  LYMPHSABS 1.8  MONOABS 0.3  EOSABS 0.1  BASOSABS 0.1    Chemistries   Recent Labs Lab 04/30/15 0823  NA 137  K 3.9  CL 105  CO2 24  GLUCOSE 132*  BUN 15  CREATININE 0.80  CALCIUM 9.2     Imaging results:   Dg Chest 2 View  04/30/2015   CLINICAL DATA:  Witnessed seizure  EXAM: CHEST  2 VIEW  COMPARISON:  08/30/2010  FINDINGS: Lungs are clear.  No pleural effusion or pneumothorax.  The heart is top-normal in size.  Degenerative changes of the visualized thoracolumbar spine.  IMPRESSION: No evidence of acute cardiopulmonary disease.   Electronically Signed   By: Julian Hy M.D.   On: 04/30/2015 09:03   Ct Head Wo Contrast  04/30/2015   CLINICAL DATA:  Witnessed seizure today with loss of consciousness.  EXAM: CT HEAD WITHOUT  CONTRAST  TECHNIQUE: Contiguous axial images were obtained from the base of the skull through the vertex without intravenous contrast.  COMPARISON:  None.  FINDINGS: Cerebral and cerebellar volume loss and probable mild chronic small-vessel white matter ischemic changes noted.  No acute intracranial abnormalities are identified, including mass lesion or mass effect, hydrocephalus,  extra-axial fluid collection, midline shift, hemorrhage, or acute infarction.  Remote fractures of the left maxillary sinus and nasal bone noted.  No acute bony abnormality identified.  IMPRESSION: No evidence of acute intracranial abnormality.  Atrophy and probable mild chronic small-vessel white matter ischemic changes.   Electronically Signed   By: Margarette Canada M.D.   On: 04/30/2015 09:09    My personal review of EKG: Sinus rhythm, left bundle branch block, prolonged QT. The patient states he has been told previously that he has a bundle branch block.   Assessment & Plan  Principal Problem:   Syncope Active Problems:   Bradycardia   High cholesterol   Hypertension   TBI (traumatic brain injury)  Syncope with question of seizure Uncertain etiology.  No previous history of syncope or seizure. Head CT is negative. Patient does have a history of traumatic brain injury. Will check orthostatic vital signs, troponin 1, hemoglobin A1c, TSH, 2-D echo, physical therapy evaluation If workup is negative would anticipate discharge 7/31.   Would consider cardiology consult if there are positive findings on the 2-D echo or troponin is elevated.  Bradycardia -  Mild (52-54) Not on beta blockers. Will monitor on telemetry.  Hypertension Will continue home medications including lisinopril and hydrochlorothiazide  Hyperlipidemia Continue Zocor   Consultants Called:  None  Family Communication:   Wife at bedside  Code Status:  full  Condition:  Guarded  Potential Disposition: to home 7/31 if work up is  negative.  Time spent in minutes : [address],  PA-C on 04/30/2015 at 11:50 AM Between 7am to 7pm - Pager - [phone] After 7pm go to [email or web address] - password [email or web address] And look for the night coverage person covering me after hours  [facility]

## 2015-04-30 NOTE — Procedures (Signed)
EEG report.  Brief clinical history:  74 y.o. 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 He presents the emergency department with an episode of syncope versus seizure this morning at [facility].   Technique: this is a 17 channel routine scalp EEG performed at the bedside with bipolar and monopolar montages arranged in accordance to the international 10/20 system of electrode placement. One channel was dedicated to EKG recording.  The study was performed during wakefulness and drowsiness. Intermittent photic stimulation was the sole activating procedure utilized.  Description:In the wakeful state, the best background consisted of a medium amplitude, posterior dominant, well sustained, symmetric and reactive 11 Hz rhythm. Drowsiness demonstrated dropout of the alpha rhythm. Intermittent photic stimulation did induce a normal driving response.  No focal or generalized epileptiform discharges noted.  No pathologic areas of slowing seen.  EKG showed sinus rhythm.  Impression: this is a normal awake and drowsy EEG. Please, be aware that a normal EEG does not exclude the possibility of epilepsy.  Clinical correlation is advised.   Marvin Portela, MD [facility]

## 2015-04-30 NOTE — Progress Notes (Signed)
EEG completed, results pending. 

## 2015-04-30 NOTE — ED Notes (Signed)
Per EMS- pt was at a restaurant when bystanders reported witnessed seizing. Reported grand mal lasting about 1-2 minutes. Pt reports before, he felt like he was going to pass out. EMS reports he was post-ictal but was A&OX4. Pt has no history of seizures. No cardiac history but EKG read left bundle branch block. CBG 114. BP 200/100. 20G placed to L hand. 324 ASA given. PT denies nausea, shortness of breath or Chest pain. 100% room air.

## 2015-04-30 NOTE — ED Notes (Signed)
Pt wife arrived to room and reports that the patient does have a history of Bundle branch block.

## 2015-04-30 NOTE — ED Provider Notes (Signed)
CSN: [id number]     Arrival date & time 04/30/15  0759 History   First MD Initiated Contact with Patient 04/30/15 [phone]     Chief Complaint  Patient presents with  . Seizures     (Consider location/radiation/quality/duration/timing/severity/associated sxs/prior Treatment) HPI  74 year old male presents after syncope in a restaurant. The patient was at the table talking to friends and family would also felt lightheaded for a few seconds then blacked out. Possibly had a seizure as well after this. Did not hit his head. Never fell out of the chair, a friend was trying to plan back up in the chair. Denies a chest pain before or after. No shortness of breath. Felt normal prior to this happening. Has a prior history of a left bundle branch block. Otherwise denies history of CHF or coronary disease.  Past Medical History  Diagnosis Date  . Hypertension   . High cholesterol   . 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 Back surgery    . Nose surgery     History reviewed. No pertinent family history. History  Substance Use Topics  . Smoking status: Never Smoker   . Smokeless tobacco: Not on file  . Alcohol Use: No    Review of Systems  Respiratory: Negative for shortness of breath.   Cardiovascular: Negative for chest pain and palpitations.  Gastrointestinal: Negative for vomiting, abdominal pain and diarrhea.  Neurological: Positive for syncope and light-headedness. Negative for weakness and headaches.  All other systems reviewed and are negative.     Allergies  Review of patient's allergies indicates no known allergies.  Home Medications   Prior to Admission medications   Medication Sig Start Date End Date Taking? Authorizing Provider  chlorpheniramine-HYDROcodone (TUSSIONEX PENNKINETIC ER) 10-8 MG/5ML LQCR Take 5 mLs by mouth.    Historical Provider, MD  lisinopril-hydrochlorothiazide  (PRINZIDE,ZESTORETIC) 20-25 MG per tablet Take 1 tablet by mouth daily.    Historical Provider, MD  Pseudoeph-Doxylamine-DM-APAP (NYQUIL PO) Take by mouth.    Historical Provider, MD  simvastatin (ZOCOR) 40 MG tablet Take 40 mg by mouth daily. 04/02/15   Historical Provider, MD  tiZANidine (ZANAFLEX) 4 MG tablet Take 4 mg by mouth every 6 (six) hours as needed. 03/30/15   Historical Provider, MD   BP 170/86 mmHg  Pulse 60  Temp(Src) 97.7 F (36.5 C) (Oral)  Resp 18  Ht  (1.778 m)  Wt 208 lb (94.348 kg)  BMI 29.84 kg/m2  SpO2 97% Physical Exam  Constitutional: He is oriented to person, place, and time. He appears well-developed and well-nourished.  HENT:  Head: Normocephalic and atraumatic.  Right Ear: External ear normal.  Left Ear: External ear normal.  Nose: Nose normal.  Eyes: EOM are normal. Pupils are equal, round, and reactive to light. Right eye exhibits no discharge. Left eye exhibits no discharge.  Neck: Neck supple.  Cardiovascular: Normal rate, regular rhythm, normal heart sounds and intact distal pulses.   No murmur heard. Pulmonary/Chest: Effort normal and breath sounds normal.  Abdominal: Soft. There is no tenderness.  Musculoskeletal: He exhibits no edema.  Neurological: He is alert and oriented to person, place, and time.  CN 2-12 grossly intact. 5/5 strength in all 4 extremities  Skin: Skin is warm and dry.  Nursing note and vitals reviewed.   ED Course  Procedures (including critical care time) Labs Review Labs Reviewed  BASIC METABOLIC PANEL -  Abnormal; Notable for the following:    Glucose, Bld 132 (*)    All other components within normal limits  CBC WITH DIFFERENTIAL/PLATELET - Abnormal; Notable for the following:    Basophils Relative 2 (*)    All other components within normal limits  I-STAT TROPOININ, ED    Imaging Review Dg Chest 2 View  04/30/2015   CLINICAL DATA:  Witnessed seizure  EXAM: CHEST  2 VIEW  COMPARISON:  08/30/2010  FINDINGS:  Lungs are clear.  No pleural effusion or pneumothorax.  The heart is top-normal in size.  Degenerative changes of the visualized thoracolumbar spine.  IMPRESSION: No evidence of acute cardiopulmonary disease.   Electronically Signed   By: Charline Bills M.D.   On: 04/30/2015 09:03   Ct Head Wo Contrast  04/30/2015   CLINICAL DATA:  Witnessed seizure today with loss of consciousness.  EXAM: CT HEAD WITHOUT CONTRAST  TECHNIQUE: Contiguous axial images were obtained from the base of the skull through the vertex without intravenous contrast.  COMPARISON:  None.  FINDINGS: Cerebral and cerebellar volume loss and probable mild chronic small-vessel white matter ischemic changes noted.  No acute intracranial abnormalities are identified, including mass lesion or mass effect, hydrocephalus, extra-axial fluid collection, midline shift, hemorrhage, or acute infarction.  Remote fractures of the left maxillary sinus and nasal bone noted.  No acute bony abnormality identified.  IMPRESSION: No evidence of acute intracranial abnormality.  Atrophy and probable mild chronic small-vessel white matter ischemic changes.   Electronically Signed   By: Harmon Pier M.D.   On: 04/30/2015 09:09     EKG Interpretation   Date/Time:  Saturday April 30 2015 08:04:59 EDT Ventricular Rate:  56 PR Interval:  217 QRS Duration: 177 QT Interval:  524 QTC Calculation: 506 R Axis:   -60 Text Interpretation:  Sinus rhythm Atrial premature complex Borderline  prolonged PR interval Left bundle branch block no significant change since  2010 Confirmed by Micai Apolinar  MD, Darianna Amy ([id number]) on 04/30/2015 8:11:18 AM      MDM   Final diagnoses:  High cholesterol  Essential hypertension    Patient with acute syncope while a rest. Concern for bradycardia given relative bradycardia now and some extra beats (but no AV blocks noted). No signs of ACS as cause. Likely shook based on syncope, given only briefly out and immediate return to normal, I  highly doubt seizure. Will need telemetry monitoring and admission.     Pricilla Loveless, MD 04/30/15 [phone]

## 2015-05-01 ENCOUNTER — Encounter (HOSPITAL_COMMUNITY): Payer: Self-pay | Admitting: Internal Medicine

## 2015-05-01 ENCOUNTER — Observation Stay (HOSPITAL_COMMUNITY): Payer: Medicare Other

## 2015-05-01 DIAGNOSIS — Z8679 Personal history of other diseases of the circulatory system: Secondary | ICD-10-CM | POA: Diagnosis not present

## 2015-05-01 DIAGNOSIS — R55 Syncope and collapse: Secondary | ICD-10-CM

## 2015-05-01 DIAGNOSIS — R001 Bradycardia, unspecified: Secondary | ICD-10-CM

## 2015-05-01 DIAGNOSIS — I951 Orthostatic hypotension: Secondary | ICD-10-CM

## 2015-05-01 DIAGNOSIS — I10 Essential (primary) hypertension: Secondary | ICD-10-CM | POA: Diagnosis not present

## 2015-05-01 DIAGNOSIS — I447 Left bundle-branch block, unspecified: Secondary | ICD-10-CM

## 2015-05-01 HISTORY — DX: Personal history of other diseases of the circulatory system: Z86.79

## 2015-05-01 LAB — BASIC METABOLIC PANEL
Anion gap: 6 (ref 5–15)
BUN: 12 mg/dL (ref 6–20)
CALCIUM: 9.5 mg/dL (ref 8.9–10.3)
CO2: 27 mmol/L (ref 22–32)
Chloride: 105 mmol/L (ref 101–111)
Creatinine, Ser: 0.93 mg/dL (ref 0.61–1.24)
GLUCOSE: 135 mg/dL — AB (ref 65–99)
POTASSIUM: 4.2 mmol/L (ref 3.5–5.1)
Sodium: 138 mmol/L (ref 135–145)

## 2015-05-01 LAB — CBC
HCT: 43.6 % (ref 39.0–52.0)
Hemoglobin: 15.2 g/dL (ref 13.0–17.0)
MCH: 31.3 pg (ref 26.0–34.0)
MCHC: 34.9 g/dL (ref 30.0–36.0)
MCV: 89.7 fL (ref 78.0–100.0)
Platelets: 178 10*3/uL (ref 150–400)
RBC: 4.86 MIL/uL (ref 4.22–5.81)
RDW: 12.8 % (ref 11.5–15.5)
WBC: 4.5 10*3/uL (ref 4.0–10.5)

## 2015-05-01 LAB — GLUCOSE, CAPILLARY: Glucose-Capillary: 118 mg/dL — ABNORMAL HIGH (ref 65–99)

## 2015-05-01 MED ORDER — SODIUM CHLORIDE 0.9 % IV SOLN
INTRAVENOUS | Status: DC
  Filled 2015-05-01: qty 1000

## 2015-05-01 MED ORDER — SODIUM CHLORIDE 0.9 % IV SOLN
INTRAVENOUS | Status: DC
  Administered 2015-05-02: 05:00:00 via INTRAVENOUS

## 2015-05-01 MED ORDER — SODIUM CHLORIDE 0.9 % IV SOLN
INTRAVENOUS | Status: DC
  Administered 2015-05-01: 16:00:00 via INTRAVENOUS

## 2015-05-01 NOTE — Progress Notes (Signed)
[facility] PROGRESS NOTE  Marvin Padilla ZOX:[id number] DOB: 21-Feb-1941 DOA: 04/30/2015 PCP: Enrique Sack, MD  Assessment/Plan: #1 syncopal episode Likely secondary to orthostasis as patient was noted to be orthostatic on admission in the setting of diuretics.. CT head negative. Chest x-ray negative for any acute infiltrate. Carotid Dopplers with no significant ICA stenosis. EEG is negative. 2-D echo pending. HCTZ has been discontinued. IV fluids. Follow.  #2 orthostasis Hold diuretics. IV fluids. Follow.  #3 hypertension Continue ACE inhibitor. Hold HCTZ secondary to problem #1 and 2.  #4 mild bradycardia Patient on beta blockers. Stable.  #5 hyperlipidemia Continue statin.  #6 prophylaxis Lovenox for DVT prophylaxis.  Code Status: Full Family Communication: Updated patient and wife at bedside. Disposition Plan: Home when medically stable and workup is complete.   Consultants:  None  Procedures:  Carotid Dopplers 05/01/2015  CT head 04/30/2015  CXR 04/30/2015  EEG 04/30/2015  Antibiotics:  None  HPI/Subjective: Patient denies any further syncopal episodes. Patient denies any chest pain. No shortness of breath.  Objective: Filed Vitals:   05/01/15 1455  BP: 149/84  Pulse: 72  Temp:   Resp:     Intake/Output Summary (Last 24 hours) at 05/01/15 1832 Last data filed at 05/01/15 1800  Gross per 24 hour  Intake  237.5 ml  Output   1470 ml  Net -1232.5 ml   Filed Weights   04/30/15 0804 04/30/15 1237 05/01/15 0412  Weight: 94.348 kg (208 lb) 94.802 kg (209 lb) 94.53 kg (208 lb 6.4 oz)    Exam:   General:  NAD  Cardiovascular: RRR  Respiratory: CTAB  Abdomen: Soft, nontender, nondistended, positive bowel sounds  Musculoskeletal: No clubbing cyanosis no edema  Data Reviewed: Basic Metabolic Panel:  Recent Labs Lab 04/30/15 0823 04/30/15 1214 05/01/15 0733  NA 137  --  138  K 3.9  --  4.2  CL 105  --  105  CO2 24  --   27  GLUCOSE 132*  --  135*  BUN 15  --  12  CREATININE 0.80  --  0.93  CALCIUM 9.2  --  9.5  MG  --  2.3  --    Liver Function Tests: No results for input(s): AST, ALT, ALKPHOS, BILITOT, PROT, ALBUMIN in the last 168 hours. No results for input(s): LIPASE, AMYLASE in the last 168 hours. No results for input(s): AMMONIA in the last 168 hours. CBC:  Recent Labs Lab 04/30/15 0823 05/01/15 0733  WBC 4.0 4.5  NEUTROABS 1.8  --   HGB 14.2 15.2  HCT 40.8 43.6  MCV 89.5 89.7  PLT 157 178   Cardiac Enzymes:  Recent Labs Lab 04/30/15 1214  TROPONINI <0.03   BNP (last 3 results) No results for input(s): BNP in the last 8760 hours.  ProBNP (last 3 results) No results for input(s): PROBNP in the last 8760 hours.  CBG:  Recent Labs Lab 05/01/15 0821  GLUCAP 118*    No results found for this or any previous visit (from the past 240 hour(s)).   Studies: Dg Chest 2 View  04/30/2015   CLINICAL DATA:  Witnessed seizure  EXAM: CHEST  2 VIEW  COMPARISON:  08/30/2010  FINDINGS: Lungs are clear.  No pleural effusion or pneumothorax.  The heart is top-normal in size.  Degenerative changes of the visualized thoracolumbar spine.  IMPRESSION: No evidence of acute cardiopulmonary disease.   Electronically Signed   By: Charline Bills M.D.   On: 04/30/2015 09:03  Ct Head Wo Contrast  04/30/2015   CLINICAL DATA:  Witnessed seizure today with loss of consciousness.  EXAM: CT HEAD WITHOUT CONTRAST  TECHNIQUE: Contiguous axial images were obtained from the base of the skull through the vertex without intravenous contrast.  COMPARISON:  None.  FINDINGS: Cerebral and cerebellar volume loss and probable mild chronic small-vessel white matter ischemic changes noted.  No acute intracranial abnormalities are identified, including mass lesion or mass effect, hydrocephalus, extra-axial fluid collection, midline shift, hemorrhage, or acute infarction.  Remote fractures of the left maxillary sinus and  nasal bone noted.  No acute bony abnormality identified.  IMPRESSION: No evidence of acute intracranial abnormality.  Atrophy and probable mild chronic small-vessel white matter ischemic changes.   Electronically Signed   By: Harmon Pier M.D.   On: 04/30/2015 09:09    Scheduled Meds: . docusate sodium  100 mg Oral Daily  . enoxaparin (LOVENOX) injection  40 mg Subcutaneous Q24H  . lisinopril  20 mg Oral Daily  . loratadine  10 mg Oral Daily  . multivitamin with minerals  1 tablet Oral Daily  . simvastatin  40 mg Oral Daily  . sodium chloride  3 mL Intravenous Q12H   Continuous Infusions: . sodium chloride 75 mL/hr at 05/01/15 1626    Principal Problem:   Syncope Active Problems:   Bradycardia   High cholesterol   Hypertension   TBI (traumatic brain injury)   Syncope and collapse   Seizures   Orthostasis   History of left bundle branch block (LBBB)    Time spent: 59 MINS    [facility] MD [facility] Pager [phone]. If 7PM-7AM, please contact night-coverage at [email or web address], password [facility] 05/01/2015, 6:32 PM

## 2015-05-01 NOTE — Progress Notes (Signed)
VASCULAR LAB PRELIMINARY  PRELIMINARY  PRELIMINARY  PRELIMINARY  Carotid duplex completed.    Preliminary report:  1-39% ICA stenosis.  Vertebral artery flow is antegrade.   Laneka Mcgrory, RVT 05/01/2015, 3:42 PM

## 2015-05-02 ENCOUNTER — Observation Stay (HOSPITAL_BASED_OUTPATIENT_CLINIC_OR_DEPARTMENT_OTHER): Payer: Medicare Other

## 2015-05-02 DIAGNOSIS — R001 Bradycardia, unspecified: Secondary | ICD-10-CM | POA: Diagnosis not present

## 2015-05-02 DIAGNOSIS — Z8679 Personal history of other diseases of the circulatory system: Secondary | ICD-10-CM

## 2015-05-02 DIAGNOSIS — R55 Syncope and collapse: Secondary | ICD-10-CM

## 2015-05-02 DIAGNOSIS — I10 Essential (primary) hypertension: Secondary | ICD-10-CM | POA: Diagnosis not present

## 2015-05-02 LAB — CBC
HCT: 40.1 % (ref 39.0–52.0)
Hemoglobin: 13.6 g/dL (ref 13.0–17.0)
MCH: 30.5 pg (ref 26.0–34.0)
MCHC: 33.9 g/dL (ref 30.0–36.0)
MCV: 89.9 fL (ref 78.0–100.0)
Platelets: 154 10*3/uL (ref 150–400)
RBC: 4.46 MIL/uL (ref 4.22–5.81)
RDW: 12.7 % (ref 11.5–15.5)
WBC: 4.2 10*3/uL (ref 4.0–10.5)

## 2015-05-02 LAB — BASIC METABOLIC PANEL
ANION GAP: 3 — AB (ref 5–15)
BUN: 12 mg/dL (ref 6–20)
CO2: 24 mmol/L (ref 22–32)
Calcium: 8.7 mg/dL — ABNORMAL LOW (ref 8.9–10.3)
Chloride: 109 mmol/L (ref 101–111)
Creatinine, Ser: 0.9 mg/dL (ref 0.61–1.24)
Glucose, Bld: 124 mg/dL — ABNORMAL HIGH (ref 65–99)
Potassium: 4 mmol/L (ref 3.5–5.1)
Sodium: 136 mmol/L (ref 135–145)

## 2015-05-02 LAB — HEMOGLOBIN A1C
Hgb A1c MFr Bld: 6.3 % — ABNORMAL HIGH (ref 4.8–5.6)
MEAN PLASMA GLUCOSE: 134 mg/dL

## 2015-05-02 LAB — GLUCOSE, CAPILLARY: Glucose-Capillary: 138 mg/dL — ABNORMAL HIGH (ref 65–99)

## 2015-05-02 MED ORDER — LISINOPRIL 40 MG PO TABS: 40.0000 mg | ORAL_TABLET | Freq: Every day | ORAL | Status: DC

## 2015-05-02 MED ORDER — LISINOPRIL 20 MG PO TABS
30.0000 mg | ORAL_TABLET | Freq: Every day | ORAL | Status: DC
  Administered 2015-05-02: 30 mg via ORAL
  Filled 2015-05-02 (×2): qty 1

## 2015-05-02 NOTE — Care Management Note (Signed)
Case Management Note  Patient Details  Name: MANOJ ENRIQUEZ MRN: [id number] Date of Birth: [birth year]  Subjective/Objective:   Patient lives with spouse, per pt eval no pt f/u needed.  No other needs identified.                 Action/Plan:   Expected Discharge Date:                  Expected Discharge Plan:  Home/Self Care  In-House Referral:     Discharge planning Services  CM Consult  Post Acute Care Choice:    Choice offered to:     DME Arranged:    DME Agency:     HH Arranged:    HH Agency:     Status of Service:  Completed, signed off  Medicare Important Message Given:    Date Medicare IM Given:    Medicare IM give by:    Date Additional Medicare IM Given:    Additional Medicare Important Message give by:     If discussed at Long Length of Stay Meetings, dates discussed:    Additional Comments:  Leone Haven, RN 05/02/2015, 11:25 AM

## 2015-05-02 NOTE — Progress Notes (Signed)
Patient discharged to home, Vitals stable for pt. IV Dc'd. Telemetry dc'd. Pt received RX, Education and discharge orders. All questions addressed. 05/02/2015 4:51 PM Marvin Padilla

## 2015-05-02 NOTE — Progress Notes (Signed)
  Echocardiogram 2D Echocardiogram has been performed.  Arvil Chaco 05/02/2015, 9:55 AM

## 2015-05-02 NOTE — Discharge Summary (Signed)
Physician Discharge Summary  Marvin Padilla JOI:[id number] DOB: [birth year] DOA: 04/30/2015  PCP: Marvin Peaches, MD  Admit date: 04/30/2015 Discharge date: 05/02/2015  Time spent: 65 minutes  Recommendations for Outpatient Follow-up:  1. Follow-up with GREEN, [name], MD in 1 week. On follow-up patient's blood pressure need to be reassessed as patient HCTZ was discontinued from his blood pressure regimen. Patient's lisinopril was increased to 40 mg daily for better blood pressure control. Basic metabolic profile need to be obtained to follow-up on patient's electrolytes and renal function.  Discharge Diagnoses:  Principal Problem:   Syncope Active Problems:   Orthostasis   Bradycardia   High cholesterol   Hypertension   TBI (traumatic brain injury)   Syncope and collapse   Seizures   History of left bundle branch block (LBBB)   Essential hypertension   Discharge Condition: Stable and improved  Diet recommendation: Regular  Filed Weights   04/30/15 1237 05/01/15 0412 05/02/15 0514  Weight: 94.802 kg (209 lb) 94.53 kg (208 lb 6.4 oz) 95.618 kg (210 lb 12.8 oz)    History of present illness:  Per Dr. [name] is a 74 y.o. 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 He presented the emergency department with an episode of syncope versus seizure on the morning of admission, at [facility]. Mr. [name] reported that he had been feeling fine lately with no issues of illness at all. He works for a [employer]. One day prior to admission, he was riding in a car for 10 hours. He got up on the morning, had a cup of coffee with honey and it and went to [employer] for his [employer] and Bible study. He had a second cup of coffee. He noticed he was feeling badly and that his vision became blurry. He slumped over on the table. He momentarily lost consciousness. When he awoke a friend with  EMT training was holding him down. The patient stated he felt very very weak. There was some question of whether he had body shakes after losing consciousness. There was no report of bowel or bladder incontinence. There is no report of tongue biting. The patient reported he had never had syncope or seizures in the past. The patient has had no recent changes in medications.  In the emergency department the patient still felt weak but looked well. Labs are reassuring. Glucose is 132, CBC and be met are normal. Urinalysis was pending. Chest x-ray was clear. CT scan of his head was negative for acute injury or bleed. An EEG was being done.   Hospital Course:  #1 syncopal episode Patient was admitted with a syncopal episode. Likely secondary to orthostasis as patient was noted to be orthostatic on admission in the setting of diuretics.. CT head negative. Chest x-ray negative for any acute infiltrate. Carotid Dopplers with no significant ICA stenosis. EEG was negative. Formal results of 2-D echo were pending at time of discharge. Cardiology reviewed 2-D echo and it was noted that patient's ejection fraction was normal and patient had no valvular or other abnormalities noted which would have led to his syncopal episode. Patient's HCTZ was discontinued. Patient was maintained on IV fluids. Patient did not have any further syncopal episodes. Patient wi  #2 orthostasis On admission patient was noted to be orthostatic. Patient's diuretics were held. Patient was hydrated with IV fluids. Patient was euvolemic by day of discharge. Patient's HCTZ was discontinued  on discharge. Outpatient follow-up.   #3 hypertension Continued on ACE inhibitor. Patient's HCTZ was discontinued secondary to orthostasis and patient's syncopal episode. Patient's lisinopril was increased to 40 mg daily on day of discharge and will follow-up with PCP as outpatient.   #4 mild bradycardia Stable.  #5 hyperlipidemia Continued on  statin.   Procedures:  2-D echo 05/02/2015  Carotid Dopplers 05/01/2015  EEG 04/30/2015  CT head 04/30/2015  Chest x-ray 04/30/2015  Consultations:  None  Discharge Exam: Filed Vitals:   05/02/15 1420  BP: 155/81  Pulse: 63  Temp: 97.8 F (36.6 C)  Resp: 18    General: NAD Cardiovascular: RRR Respiratory: CTAB  Discharge Instructions   Discharge Instructions    Diet general    Complete by:  As directed      Discharge instructions    Complete by:  As directed   Follow up with GREEN, Marvin Bachelor, MD in 1 week.     Increase activity slowly    Complete by:  As directed           Current Discharge Medication List    START taking these medications   Details  lisinopril (PRINIVIL,ZESTRIL) 40 MG tablet Take 1 tablet (40 mg total) by mouth daily. Qty: 30 tablet, Refills: 1      CONTINUE these medications which have NOT CHANGED   Details  acetaminophen (TYLENOL) 325 MG tablet Take 650 mg by mouth every evening.    Docusate Calcium (STOOL SOFTENER PO) Take 1 tablet by mouth daily.    loratadine (CLARITIN) 10 MG tablet Take 10 mg by mouth daily.    Multiple Vitamins-Minerals (MULTIVITAMIN ADULTS 50+) TABS Take 1 tablet by mouth daily.    simvastatin (ZOCOR) 40 MG tablet Take 40 mg by mouth daily. Refills: 4      STOP taking these medications     lisinopril-hydrochlorothiazide (PRINZIDE,ZESTORETIC) 20-25 MG per tablet        No Known Allergies Follow-up Information    Follow up with GREEN, [name], MD. Schedule an appointment as soon as possible for a visit in 1 week.   Specialty:  Internal Medicine   Contact information:   [address] [place] [address] [phone]        The results of significant diagnostics from this hospitalization (including imaging, microbiology, ancillary and laboratory) are listed below for reference.    Significant Diagnostic Studies: Dg Chest 2 View  04/30/2015   CLINICAL DATA:  Witnessed  seizure  EXAM: CHEST  2 VIEW  COMPARISON:  08/30/2010  FINDINGS: Lungs are clear.  No pleural effusion or pneumothorax.  The heart is top-normal in size.  Degenerative changes of the visualized thoracolumbar spine.  IMPRESSION: No evidence of acute cardiopulmonary disease.   Electronically Signed   By: Julian Hy M.D.   On: 04/30/2015 09:03   Ct Head Wo Contrast  04/30/2015   CLINICAL DATA:  Witnessed seizure today with loss of consciousness.  EXAM: CT HEAD WITHOUT CONTRAST  TECHNIQUE: Contiguous axial images were obtained from the base of the skull through the vertex without intravenous contrast.  COMPARISON:  None.  FINDINGS: Cerebral and cerebellar volume loss and probable mild chronic small-vessel white matter ischemic changes noted.  No acute intracranial abnormalities are identified, including mass lesion or mass effect, hydrocephalus, extra-axial fluid collection, midline shift, hemorrhage, or acute infarction.  Remote fractures of the left maxillary sinus and nasal bone noted.  No acute bony abnormality identified.  IMPRESSION: No evidence  of acute intracranial abnormality.  Atrophy and probable mild chronic small-vessel white matter ischemic changes.   Electronically Signed   By: Margarette Canada M.D.   On: 04/30/2015 09:09    Microbiology: No results found for this or any previous visit (from the past 240 hour(s)).   Labs: Basic Metabolic Panel:  Recent Labs Lab 04/30/15 0823 04/30/15 1214 05/01/15 0733 05/02/15 0604  NA 137  --  138 136  K 3.9  --  4.2 4.0  CL 105  --  105 109  CO2 24  --  27 24  GLUCOSE 132*  --  135* 124*  BUN 15  --  12 12  CREATININE 0.80  --  0.93 0.90  CALCIUM 9.2  --  9.5 8.7*  MG  --  2.3  --   --    Liver Function Tests: No results for input(s): AST, ALT, ALKPHOS, BILITOT, PROT, ALBUMIN in the last 168 hours. No results for input(s): LIPASE, AMYLASE in the last 168 hours. No results for input(s): AMMONIA in the last 168 hours. CBC:  Recent  Labs Lab 04/30/15 0823 05/01/15 0733 05/02/15 0604  WBC 4.0 4.5 4.2  NEUTROABS 1.8  --   --   HGB 14.2 15.2 13.6  HCT 40.8 43.6 40.1  MCV 89.5 89.7 89.9  PLT 157 178 154   Cardiac Enzymes:  Recent Labs Lab 04/30/15 1214  TROPONINI <0.03   BNP: BNP (last 3 results) No results for input(s): BNP in the last 8760 hours.  ProBNP (last 3 results) No results for input(s): PROBNP in the last 8760 hours.  CBG:  Recent Labs Lab 05/01/15 0821 05/02/15 0814  GLUCAP 118* 138*       Signed:  Kezia Benevides M.D. [facility] 05/02/2015, 4:08 PM

## 2015-05-10 DIAGNOSIS — M62830 Muscle spasm of back: Secondary | ICD-10-CM | POA: Diagnosis not present

## 2015-05-10 DIAGNOSIS — R55 Syncope and collapse: Secondary | ICD-10-CM | POA: Diagnosis not present

## 2015-05-30 DIAGNOSIS — L239 Allergic contact dermatitis, unspecified cause: Secondary | ICD-10-CM | POA: Diagnosis not present

## 2015-06-02 DIAGNOSIS — Z23 Encounter for immunization: Secondary | ICD-10-CM | POA: Diagnosis not present

## 2015-06-02 DIAGNOSIS — I10 Essential (primary) hypertension: Secondary | ICD-10-CM | POA: Diagnosis not present

## 2015-06-27 DIAGNOSIS — I10 Essential (primary) hypertension: Secondary | ICD-10-CM | POA: Diagnosis not present

## 2015-08-29 IMAGING — CT CT HEAD W/O CM
1 of 2 series · 16 of 30 positions shown, 20 images · non-contrast
Comparison: None.

CLINICAL DATA: Witnessed seizure today with loss of consciousness.

EXAM:
CT HEAD WITHOUT CONTRAST
TECHNIQUE: Contiguous axial images were obtained from the base of the skull
through the vertex without intravenous contrast.

[Series 3: head 2.0 h70h · axial · 0.46mm/px · z∈[-140,+12]mm · 16 of 85 slices shown, 20 images]
[im 5/85  brain]
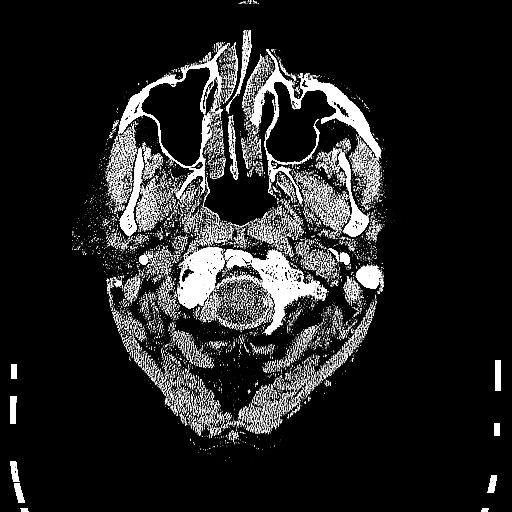
[im 5/85  bone]
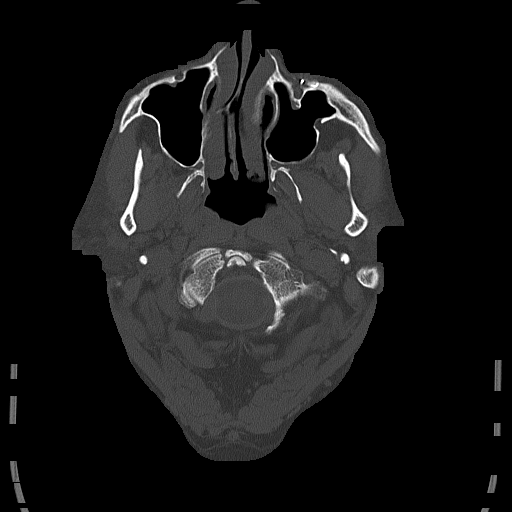
[im 9/85  brain]
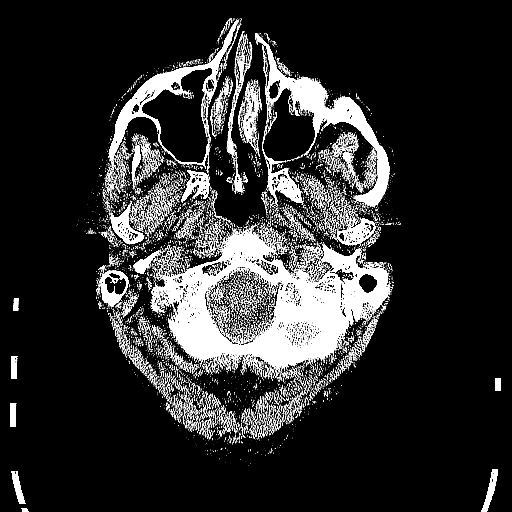
[im 17/85  brain]
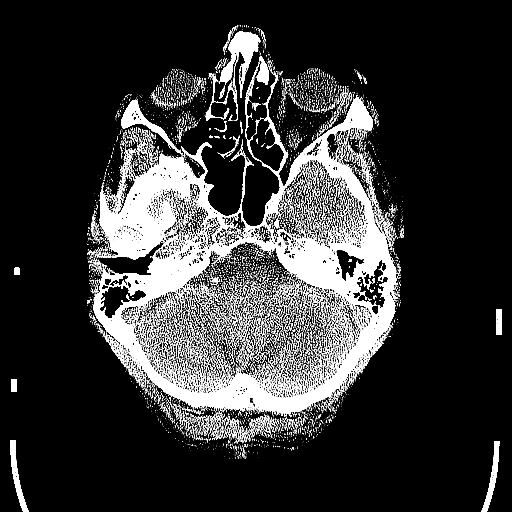
[im 21/85  brain]
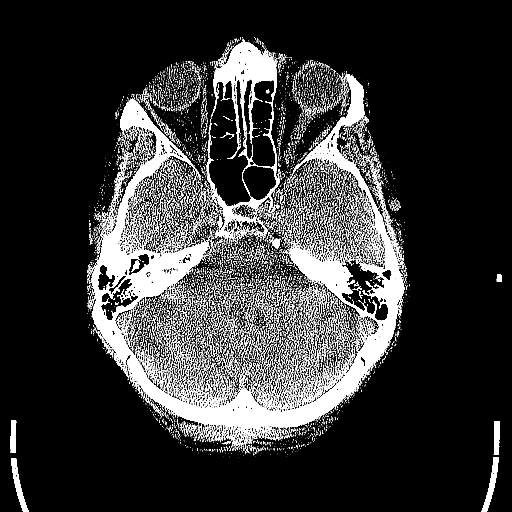
[im 25/85  brain]
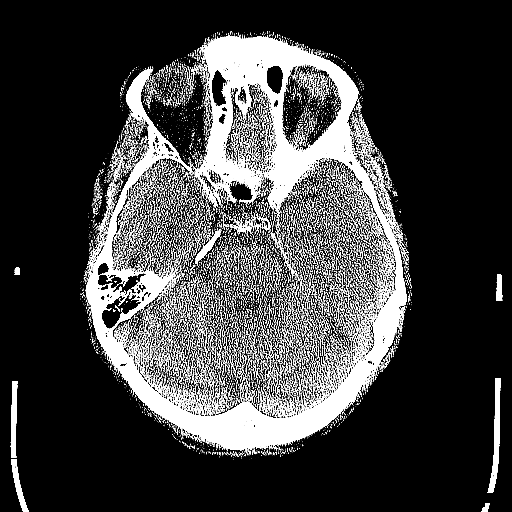
[im 25/85  bone]
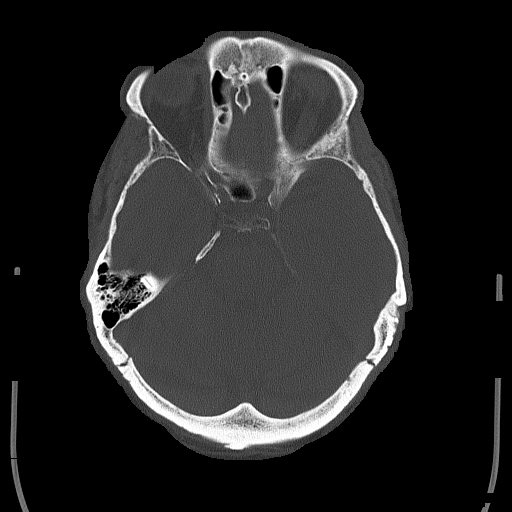
[im 29/85  brain]
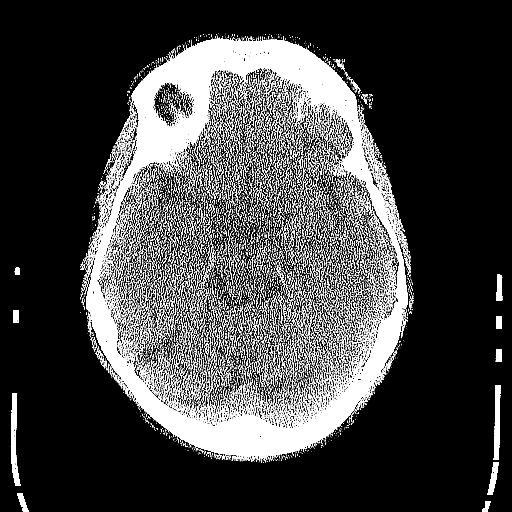
[im 37/85  brain]
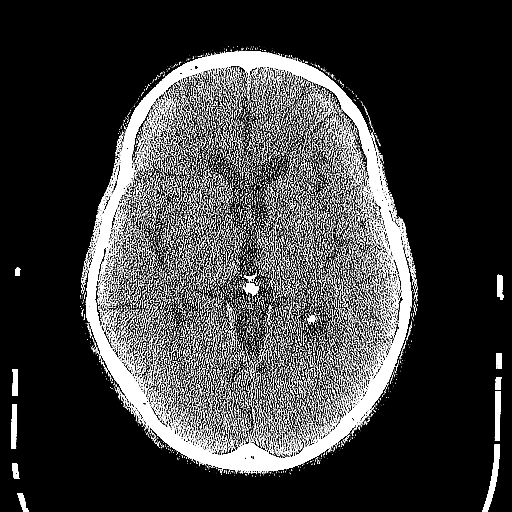
[im 41/85  brain]
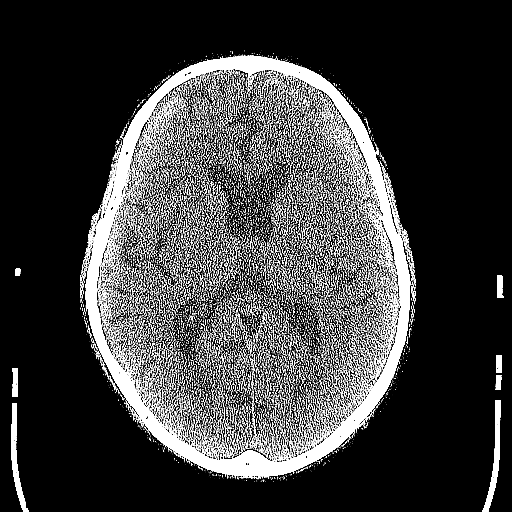
[im 45/85  brain]
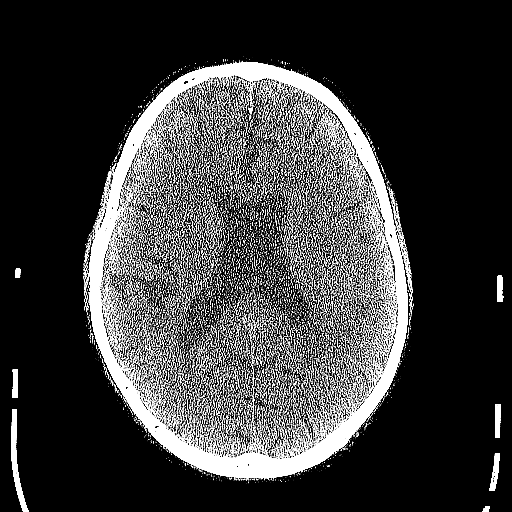
[im 45/85  bone]
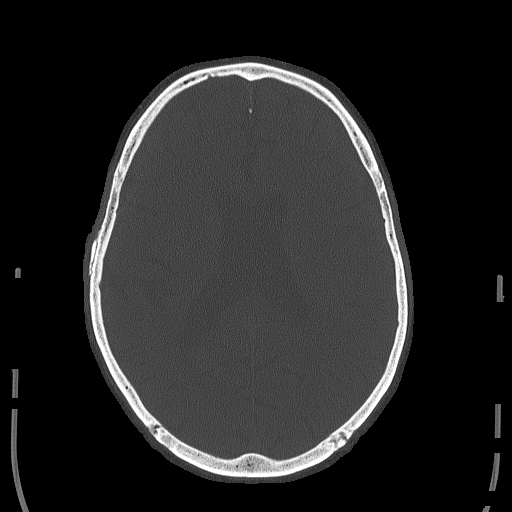
[im 49/85  brain]
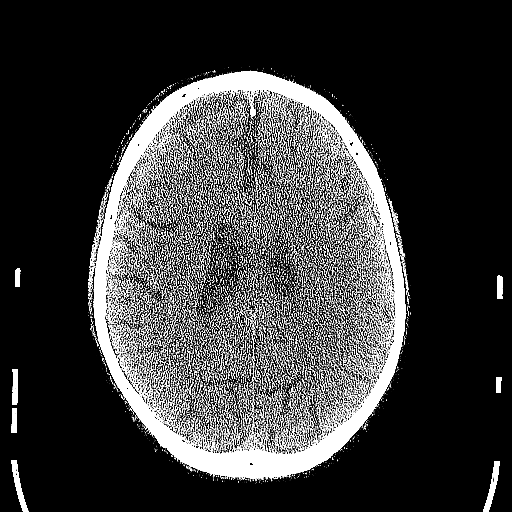
[im 57/85  brain]
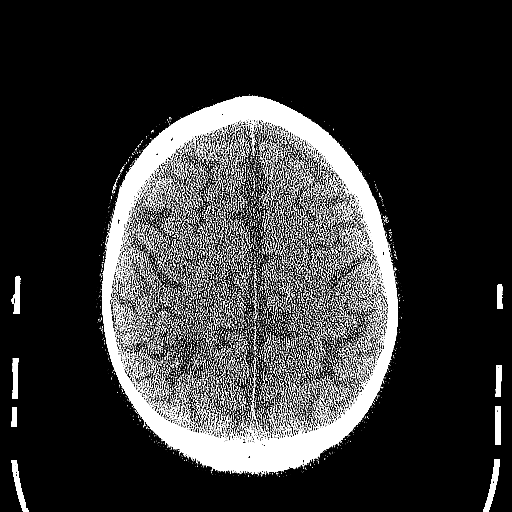
[im 61/85  brain]
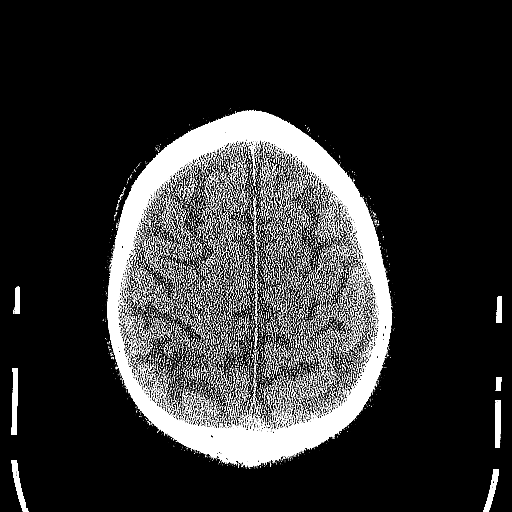
[im 65/85  brain]
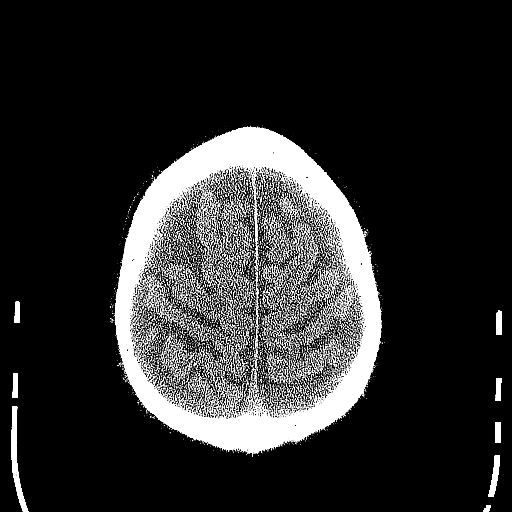
[im 65/85  bone]
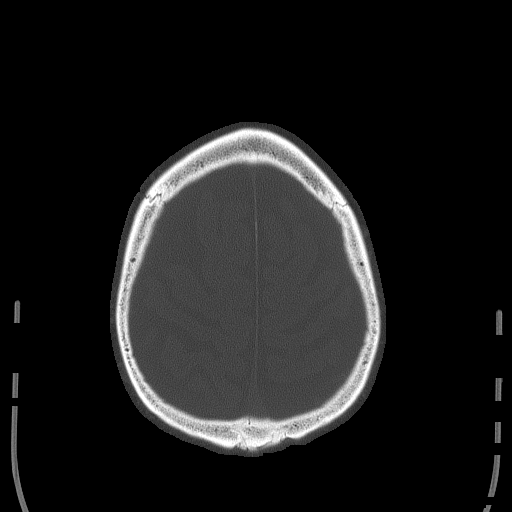
[im 69/85  brain]
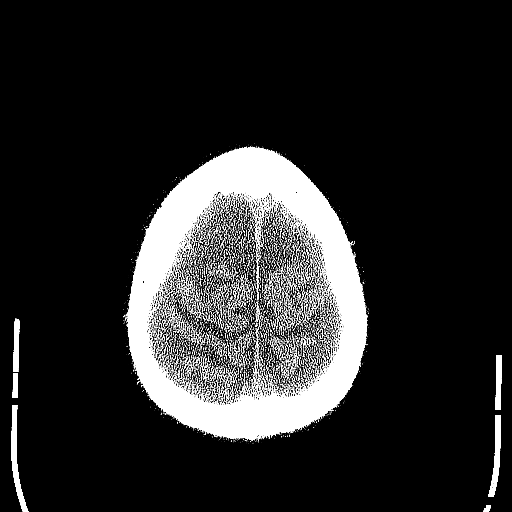
[im 77/85  brain]
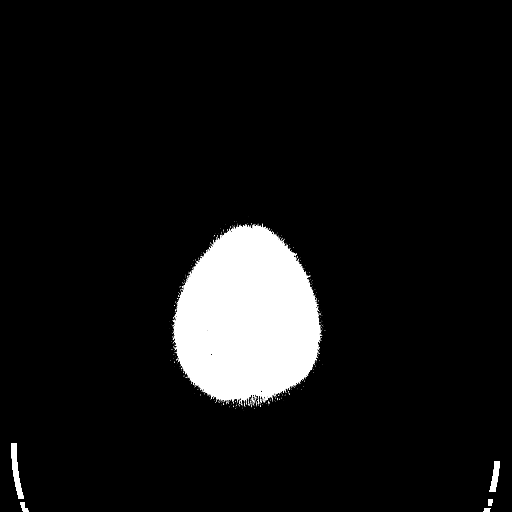
[im 81/85  brain]
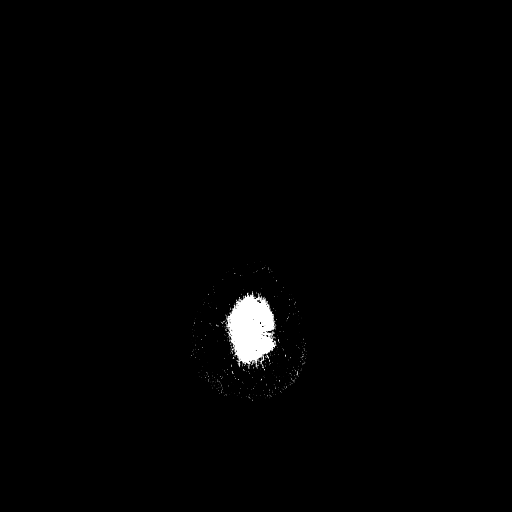

[16 of 30 positions shown; findings below may reference images not displayed]

FINDINGS: Cerebral and cerebellar volume loss and probable mild chronic
small-vessel white matter ischemic changes noted.

No acute intracranial abnormalities are identified, including mass
lesion or mass effect, hydrocephalus, extra-axial fluid collection,
midline shift, hemorrhage, or acute infarction.

Remote fractures of the left maxillary sinus and nasal bone noted.

No acute bony abnormality identified.
IMPRESSION: No evidence of acute intracranial abnormality.

Atrophy and probable mild chronic small-vessel white matter ischemic
changes.

## 2015-11-01 DIAGNOSIS — L304 Erythema intertrigo: Secondary | ICD-10-CM | POA: Diagnosis not present

## 2015-11-01 DIAGNOSIS — L218 Other seborrheic dermatitis: Secondary | ICD-10-CM | POA: Diagnosis not present

## 2015-11-23 DIAGNOSIS — I10 Essential (primary) hypertension: Secondary | ICD-10-CM | POA: Diagnosis not present

## 2015-12-12 DIAGNOSIS — L218 Other seborrheic dermatitis: Secondary | ICD-10-CM | POA: Diagnosis not present

## 2016-03-14 DIAGNOSIS — H25813 Combined forms of age-related cataract, bilateral: Secondary | ICD-10-CM | POA: Diagnosis not present

## 2016-03-14 DIAGNOSIS — H11153 Pinguecula, bilateral: Secondary | ICD-10-CM | POA: Diagnosis not present

## 2016-03-14 DIAGNOSIS — H04123 Dry eye syndrome of bilateral lacrimal glands: Secondary | ICD-10-CM | POA: Diagnosis not present

## 2016-03-23 DIAGNOSIS — L570 Actinic keratosis: Secondary | ICD-10-CM | POA: Diagnosis not present

## 2016-03-23 DIAGNOSIS — L259 Unspecified contact dermatitis, unspecified cause: Secondary | ICD-10-CM | POA: Diagnosis not present

## 2016-05-02 DIAGNOSIS — D559 Anemia due to enzyme disorder, unspecified: Secondary | ICD-10-CM | POA: Diagnosis not present

## 2016-05-02 DIAGNOSIS — E785 Hyperlipidemia, unspecified: Secondary | ICD-10-CM | POA: Diagnosis not present

## 2016-05-02 DIAGNOSIS — Z0000 Encounter for general adult medical examination without abnormal findings: Secondary | ICD-10-CM | POA: Diagnosis not present

## 2016-05-02 DIAGNOSIS — Z1283 Encounter for screening for malignant neoplasm of skin: Secondary | ICD-10-CM | POA: Diagnosis not present

## 2016-05-02 DIAGNOSIS — Z125 Encounter for screening for malignant neoplasm of prostate: Secondary | ICD-10-CM | POA: Diagnosis not present

## 2016-05-02 DIAGNOSIS — I10 Essential (primary) hypertension: Secondary | ICD-10-CM | POA: Diagnosis not present

## 2016-07-09 DIAGNOSIS — L237 Allergic contact dermatitis due to plants, except food: Secondary | ICD-10-CM | POA: Diagnosis not present

## 2016-07-19 DIAGNOSIS — L237 Allergic contact dermatitis due to plants, except food: Secondary | ICD-10-CM | POA: Diagnosis not present

## 2016-08-06 DIAGNOSIS — L570 Actinic keratosis: Secondary | ICD-10-CM | POA: Diagnosis not present

## 2016-08-06 DIAGNOSIS — L821 Other seborrheic keratosis: Secondary | ICD-10-CM | POA: Diagnosis not present

## 2016-09-14 ENCOUNTER — Inpatient Hospital Stay (HOSPITAL_COMMUNITY)
Admission: EM | Admit: 2016-09-14 | Discharge: 2016-09-15 | DRG: 242 | Disposition: A | Discharge status: Home / Self Care | Payer: Medicare Other | Attending: Internal Medicine | Admitting: Internal Medicine

## 2016-09-14 ENCOUNTER — Encounter (HOSPITAL_COMMUNITY): Payer: Self-pay | Admitting: Emergency Medicine

## 2016-09-14 ENCOUNTER — Emergency Department (HOSPITAL_COMMUNITY): Payer: Medicare Other

## 2016-09-14 ENCOUNTER — Encounter (HOSPITAL_COMMUNITY)
Admission: EM | Disposition: A | Discharge status: Home / Self Care | Payer: Self-pay | Source: Home / Self Care | Attending: Internal Medicine

## 2016-09-14 DIAGNOSIS — I462 Cardiac arrest due to underlying cardiac condition: Secondary | ICD-10-CM | POA: Diagnosis present

## 2016-09-14 DIAGNOSIS — I447 Left bundle-branch block, unspecified: Secondary | ICD-10-CM | POA: Diagnosis present

## 2016-09-14 DIAGNOSIS — E7800 Pure hypercholesterolemia, unspecified: Secondary | ICD-10-CM | POA: Diagnosis not present

## 2016-09-14 DIAGNOSIS — R001 Bradycardia, unspecified: Secondary | ICD-10-CM | POA: Diagnosis present

## 2016-09-14 DIAGNOSIS — Z87820 Personal history of traumatic brain injury: Secondary | ICD-10-CM

## 2016-09-14 DIAGNOSIS — Z8249 Family history of ischemic heart disease and other diseases of the circulatory system: Secondary | ICD-10-CM | POA: Diagnosis not present

## 2016-09-14 DIAGNOSIS — Z79899 Other long term (current) drug therapy: Secondary | ICD-10-CM

## 2016-09-14 DIAGNOSIS — I442 Atrioventricular block, complete: Principal | ICD-10-CM

## 2016-09-14 DIAGNOSIS — R404 Transient alteration of awareness: Secondary | ICD-10-CM | POA: Diagnosis not present

## 2016-09-14 DIAGNOSIS — Z950 Presence of cardiac pacemaker: Secondary | ICD-10-CM

## 2016-09-14 DIAGNOSIS — I10 Essential (primary) hypertension: Secondary | ICD-10-CM | POA: Diagnosis not present

## 2016-09-14 DIAGNOSIS — R55 Syncope and collapse: Secondary | ICD-10-CM | POA: Diagnosis not present

## 2016-09-14 DIAGNOSIS — R42 Dizziness and giddiness: Secondary | ICD-10-CM | POA: Diagnosis not present

## 2016-09-14 HISTORY — PX: PACEMAKER IMPLANT: EP1218

## 2016-09-14 HISTORY — DX: Presence of cardiac pacemaker: Z95.0

## 2016-09-14 HISTORY — PX: EP IMPLANTABLE DEVICE: SHX172B

## 2016-09-14 LAB — URINALYSIS, ROUTINE W REFLEX MICROSCOPIC
BILIRUBIN URINE: NEGATIVE
Glucose, UA: NEGATIVE mg/dL
HGB URINE DIPSTICK: NEGATIVE
KETONES UR: NEGATIVE mg/dL
Leukocytes, UA: NEGATIVE
NITRITE: NEGATIVE
PROTEIN: NEGATIVE mg/dL
Specific Gravity, Urine: 1.004 — ABNORMAL LOW (ref 1.005–1.030)
pH: 7 (ref 5.0–8.0)

## 2016-09-14 LAB — COMPREHENSIVE METABOLIC PANEL
ALT: 27 U/L (ref 17–63)
AST: 31 U/L (ref 15–41)
Albumin: 4 g/dL (ref 3.5–5.0)
Alkaline Phosphatase: 87 U/L (ref 38–126)
Anion gap: 9 (ref 5–15)
BILIRUBIN TOTAL: 0.8 mg/dL (ref 0.3–1.2)
BUN: 9 mg/dL (ref 6–20)
CALCIUM: 9.1 mg/dL (ref 8.9–10.3)
CHLORIDE: 104 mmol/L (ref 101–111)
CO2: 25 mmol/L (ref 22–32)
CREATININE: 1.01 mg/dL (ref 0.61–1.24)
Glucose, Bld: 158 mg/dL — ABNORMAL HIGH (ref 65–99)
Potassium: 3.5 mmol/L (ref 3.5–5.1)
Sodium: 138 mmol/L (ref 135–145)
TOTAL PROTEIN: 6.4 g/dL — AB (ref 6.5–8.1)

## 2016-09-14 LAB — I-STAT CHEM 8, ED
BUN: 12 mg/dL (ref 6–20)
CALCIUM ION: 1.19 mmol/L (ref 1.15–1.40)
CHLORIDE: 101 mmol/L (ref 101–111)
Creatinine, Ser: 0.9 mg/dL (ref 0.61–1.24)
Glucose, Bld: 157 mg/dL — ABNORMAL HIGH (ref 65–99)
HCT: 42 % (ref 39.0–52.0)
Hemoglobin: 14.3 g/dL (ref 13.0–17.0)
POTASSIUM: 3.4 mmol/L — AB (ref 3.5–5.1)
SODIUM: 138 mmol/L (ref 135–145)
TCO2: 26 mmol/L (ref 0–100)

## 2016-09-14 LAB — CBC
HEMATOCRIT: 42.9 % (ref 39.0–52.0)
Hemoglobin: 15 g/dL (ref 13.0–17.0)
MCH: 31.3 pg (ref 26.0–34.0)
MCHC: 35 g/dL (ref 30.0–36.0)
MCV: 89.4 fL (ref 78.0–100.0)
PLATELETS: 189 10*3/uL (ref 150–400)
RBC: 4.8 MIL/uL (ref 4.22–5.81)
RDW: 12.6 % (ref 11.5–15.5)
WBC: 6.5 10*3/uL (ref 4.0–10.5)

## 2016-09-14 LAB — TROPONIN I

## 2016-09-14 SURGERY — PACEMAKER IMPLANT
Anesthesia: LOCAL

## 2016-09-14 MED ORDER — ENOXAPARIN SODIUM 40 MG/0.4ML ~~LOC~~ SOLN: 40.0000 mg | SUBCUTANEOUS | Status: DC

## 2016-09-14 MED ORDER — FENTANYL CITRATE (PF) 100 MCG/2ML IJ SOLN
INTRAMUSCULAR | Status: DC | PRN
  Administered 2016-09-14 (×2): 12.5 ug via INTRAVENOUS

## 2016-09-14 MED ORDER — CEFAZOLIN SODIUM-DEXTROSE 2-4 GM/100ML-% IV SOLN
2.0000 g | INTRAVENOUS | Status: AC
  Administered 2016-09-14: 2 g via INTRAVENOUS

## 2016-09-14 MED ORDER — LIDOCAINE HCL (PF) 1 % IJ SOLN
INTRAMUSCULAR | Status: DC | PRN
Start: 2016-09-14 — End: 2016-09-14
  Administered 2016-09-14: 60 mL

## 2016-09-14 MED ORDER — CEFAZOLIN SODIUM-DEXTROSE 2-4 GM/100ML-% IV SOLN
INTRAVENOUS | Status: AC
  Filled 2016-09-14: qty 100

## 2016-09-14 MED ORDER — CHLORHEXIDINE GLUCONATE 4 % EX LIQD: 60.0000 mL | Freq: Once | CUTANEOUS | Status: DC

## 2016-09-14 MED ORDER — ACETAMINOPHEN 325 MG PO TABS
325.0000 mg | ORAL_TABLET | ORAL | Status: DC | PRN
  Filled 2016-09-14: qty 2

## 2016-09-14 MED ORDER — SODIUM CHLORIDE 0.9 % IV SOLN: INTRAVENOUS | Status: DC

## 2016-09-14 MED ORDER — ONDANSETRON HCL 4 MG/2ML IJ SOLN: 4.0000 mg | Freq: Four times a day (QID) | INTRAMUSCULAR | Status: DC | PRN

## 2016-09-14 MED ORDER — LISINOPRIL 40 MG PO TABS: 40.0000 mg | ORAL_TABLET | Freq: Every day | ORAL | Status: DC

## 2016-09-14 MED ORDER — MIDAZOLAM HCL 5 MG/5ML IJ SOLN
INTRAMUSCULAR | Status: AC
  Filled 2016-09-14: qty 5

## 2016-09-14 MED ORDER — SIMVASTATIN 20 MG PO TABS
40.0000 mg | ORAL_TABLET | Freq: Every day | ORAL | Status: DC
  Filled 2016-09-14: qty 2

## 2016-09-14 MED ORDER — CEFAZOLIN IN D5W 1 GM/50ML IV SOLN
1.0000 g | Freq: Four times a day (QID) | INTRAVENOUS | Status: AC
  Administered 2016-09-14 – 2016-09-15 (×3): 1 g via INTRAVENOUS
  Filled 2016-09-14 (×3): qty 50

## 2016-09-14 MED ORDER — HEPARIN (PORCINE) IN NACL 2-0.9 UNIT/ML-% IJ SOLN
INTRAMUSCULAR | Status: DC | PRN
  Administered 2016-09-14: 17:00:00

## 2016-09-14 MED ORDER — YOU HAVE A PACEMAKER BOOK
Freq: Once | Status: AC
  Administered 2016-09-14: 21:00:00
  Filled 2016-09-14: qty 1

## 2016-09-14 MED ORDER — SODIUM CHLORIDE 0.9 % IV SOLN
INTRAVENOUS | Status: DC
  Administered 2016-09-14: 21:00:00 via INTRAVENOUS

## 2016-09-14 MED ORDER — LIDOCAINE HCL (PF) 1 % IJ SOLN
INTRAMUSCULAR | Status: AC
  Filled 2016-09-14: qty 60

## 2016-09-14 MED ORDER — GENTAMICIN SULFATE 40 MG/ML IJ SOLN
80.0000 mg | INTRAMUSCULAR | Status: AC
  Administered 2016-09-14: 80 mg

## 2016-09-14 MED ORDER — MIDAZOLAM HCL 5 MG/5ML IJ SOLN
INTRAMUSCULAR | Status: DC | PRN
  Administered 2016-09-14 (×2): 1 mg via INTRAVENOUS

## 2016-09-14 MED ORDER — HEPARIN (PORCINE) IN NACL 2-0.9 UNIT/ML-% IJ SOLN
INTRAMUSCULAR | Status: AC
  Filled 2016-09-14: qty 500

## 2016-09-14 MED ORDER — FENTANYL CITRATE (PF) 100 MCG/2ML IJ SOLN
INTRAMUSCULAR | Status: AC
  Filled 2016-09-14: qty 2

## 2016-09-14 MED ORDER — SODIUM CHLORIDE 0.9 % IR SOLN
Status: AC
  Filled 2016-09-14: qty 2

## 2016-09-14 SURGICAL SUPPLY — 7 items
CABLE SURGICAL S-101-97-12 (CABLE) ×1 IMPLANT
LEAD CAPSURE NOVUS 5076-52CM (Lead) ×1 IMPLANT
LEAD CAPSURE NOVUS 5076-58CM (Lead) ×1 IMPLANT
PAD DEFIB LIFELINK (PAD) ×1 IMPLANT
PPM ADVISA MRI DR A2DR01 (Pacemaker) ×1 IMPLANT
SHEATH CLASSIC 7F (SHEATH) ×3 IMPLANT
TRAY PACEMAKER INSERTION (PACKS) ×1 IMPLANT

## 2016-09-14 NOTE — H&P (Signed)
History & Physical    Patient ID: Marvin Peacockorman H Suder MRN: [id number], DOB/AGE: Aug 25, 1941   Admit date: 09/14/2016   Primary Physician: Enrique SackGREEN, [name], MD Primary Cardiologist: New to [facility]   History of Present Illness    Marvin Padilla is a 75 y.o. male with past medical history of HTN, HLD, and known LBBB who [facility] for evaluation of flushing and dizziness.   The patient reports he was in his usual state of health until lunchtime today when he developed a sudden dizziness and flushing while driving. He pulled over and went [facility] food to see if this would relieve his symptoms but they persisted. A fireman walked by and checked his pulse and it was noted to be in the 30's, therefore he called EMS.   He denies any associated presyncope. Did have a syncopal episode in 2016 which he reports was attributed to hypotension. Echocardiogram was performed and showed a normal EF of 60-65% with Grade 1 DD and no wall motion abnormalities.   Labs are in the process of being obtained. Results thus far show a WBC of 6.5, Hgb 15.0, and platelets 189. K+ 3.4 and creatinine 1.19. Troponin pending. CXR shows no acute cardiopulmonary abnormalities. EKG shows complete heart block with HR of 41.    Past Medical History        Past Medical History:  Diagnosis Date  . High cholesterol   . History of left bundle branch block (LBBB) 05/01/2015  . Hypertension   . 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 BACK SURGERY    . NOSE SURGERY       Allergies  No Known Allergies   Home Medications           Prior to Admission medications   Medication Sig Start Date End Date Taking? Authorizing Provider  acetaminophen (TYLENOL) 325 MG tablet Take 650 mg by mouth every evening.    Historical Provider, MD  Docusate Calcium (STOOL SOFTENER  PO) Take 1 tablet by mouth daily.    Historical Provider, MD  lisinopril (PRINIVIL,ZESTRIL) 40 MG tablet Take 1 tablet (40 mg total) by mouth daily. 05/02/15   Rodolph Bonganiel V Thompson, MD  loratadine (CLARITIN) 10 MG tablet Take 10 mg by mouth daily.    Historical Provider, MD  Multiple Vitamins-Minerals (MULTIVITAMIN ADULTS 50+) TABS Take 1 tablet by mouth daily.    Historical Provider, MD  simvastatin (ZOCOR) 40 MG tablet Take 40 mg by mouth daily. 04/02/15   Historical Provider, MD    Family History         Family History  Problem Relation Age of Onset  . Heart failure Mother     Social History    Social History        Social History  . Marital status: Married    Spouse name: N/A  . Number of children: N/A  . Years of education: N/A      Occupational History  . Not on file.       Social History Main Topics  . Smoking status: Never Smoker  . Smokeless tobacco: Never Used  . Alcohol use No  . Drug use: Unknown  . Sexual activity: Not on file       Other Topics Concern  . Not on file  Social History Narrative  . No narrative on file     Review of Systems    General:  No chills, fever, night sweats or weight changes. Positive for dizziness and flushing.  Cardiovascular:  No chest pain, dyspnea on exertion, edema, orthopnea, palpitations, paroxysmal nocturnal dyspnea. Dermatological: No rash, lesions/masses Respiratory: No cough, dyspnea Urologic: No hematuria, dysuria Abdominal:   No nausea, vomiting, diarrhea, bright red blood per rectum, melena, or hematemesis Neurologic:  No visual changes, wkns, changes in mental status. All other systems reviewed and are otherwise negative except as noted above.  Physical Exam    Blood pressure 153/86, pulse (!) 40, temperature 98.2 F (36.8 C), temperature source Oral, resp. rate 18, SpO2 100 %.  General: Well developed, well nourished, Caucasian male in no acute distress. Head: Normocephalic,  atraumatic, sclera non-icteric, no xanthomas, nares are without discharge. Dentition:  Neck: No carotid bruits. JVD not elevated.  Lungs: Respirations regular and unlabored, without wheezes or rales.  Heart: Regular rhythm, bradycardiac rate. No S3 or S4.  No murmur, no rubs, or gallops appreciated. Abdomen: Soft, non-tender, non-distended with normoactive bowel sounds. No hepatomegaly. No rebound/guarding. No obvious abdominal masses. Msk:  Strength and tone appear normal for age. No joint deformities or effusions. Extremities: No clubbing or cyanosis. No edema.  Distal pedal pulses are 2+ bilaterally. Neuro: Alert and oriented X 3. Moves all extremities spontaneously. No focal deficits noted. Psych:  Responds to questions appropriately with a normal affect. Skin: No rashes or lesions noted  Labs    Troponin (Point of Care Test) Recent Labs (last 2 labs)   No results for input(s): TROPIPOC in the last 72 hours.    Recent Labs (last 2 labs)    Recent Labs  09/14/16 1331  TROPONINI <0.03     Recent Labs       Lab Results  Component Value Date   WBC 6.5 09/14/2016   HGB 14.3 09/14/2016   HCT 42.0 09/14/2016   MCV 89.4 09/14/2016   PLT 189 09/14/2016       Last Labs    Recent Labs Lab 09/14/16 1331 09/14/16 1414  NA 138 138  K 3.5 3.4*  CL 104 101  CO2 25  --   BUN 9 12  CREATININE 1.01 0.90  CALCIUM 9.1  --   PROT 6.4*  --   BILITOT 0.8  --   ALKPHOS 87  --   ALT 27  --   AST 31  --   GLUCOSE 158* 157*      Radiology Studies    Dg Chest Port 1 View  Result Date: 09/14/2016 CLINICAL DATA:  Near syncopal episode. Patient found to be bradycardic prior to coming to the emergency department. EXAM: PORTABLE CHEST 1 VIEW COMPARISON:  PA and lateral chest x-ray of April 30, 2015 FINDINGS: The lungs are well-expanded and clear. External pacemaker defibrillator pads are present on the left. The heart and pulmonary vascularity are normal. The  mediastinum is normal in width. There is faint calcification in the wall of the aortic arch. The bony thorax exhibits no acute abnormality. IMPRESSION: There is no acute cardiopulmonary abnormality. Electronically Signed   By: David  [place] M.D.   On: 09/14/2016 13:52    EKG & Cardiac Imaging    EKG: Complete heart block with HR of 41  ECHOCARDIOGRAM: 05/02/2015 Study Conclusions  - Left ventricle: The cavity size was normal. Wall thickness was increased in a pattern of mild LVH. Systolic function was normal.  The estimated ejection fraction was in the range of 60% to 65%. Doppler parameters are consistent with abnormal left ventricular relaxation (grade 1 diastolic dysfunction).  Assessment & Plan    1. Complete heart block (HCC) - developed acute flushing and dizziness earlier today. Found to have a HR in the 30's upon EMS arrival.  - Labs thus far show a WBC of 6.5, Hgb 15.0, and platelets 189. K+ 3.4 and creatinine 1.19. Troponin pending. CXR shows no acute cardiopulmonary abnormalities.  - EKG shows complete heart block with HR of 41. - Echocardiogram in 05/2015 showed  a normal EF of 60-65% with Grade 1 DD and no wall motion abnormalities.  - would benefit from PPM placement in the setting of complete heart block. Will discuss with EP.   2. LBBB (left bundle branch block) - chronic, noted on previous tracings.   3. HTN - continue PTA Lisinopril.   4. HLD - continue statin therapy.   Signed, Ellsworth LennoxBrittany M Strader, PA-C 09/14/2016, 2:52 PM Pager: [phone]  EP Attending  Patient seen and examined. Agree with above. The patient presents with CHB in the setting of underlying LBBB. In the ER he has brief asystole with a 10 second pause. He is on no reversible meds and nothing is reversible. I have discussed the indications, risks/benefits/goals/expectations of PPM insertion and he wishes to proceed.  Leonia ReevesGregg Jordie Skalsky,M.D.

## 2016-09-14 NOTE — H&P (Signed)
History & Physical    Patient ID: Marvin Padilla MRN: [id number], DOB/AGE: 1941/09/14   Admit date: 09/14/2016   Primary Physician: Enrique SackGREEN, [name], MD Primary Cardiologist: New to [facility]   History of Present Illness    Marvin Peacockorman H Seat is a 75 y.o. male with past medical history of HTN, HLD, and known LBBB who presented to [facility] on 09/14/2016 for evaluation of flushing and dizziness.   The patient reports he was in his usual state of health until lunchtime today when he developed a sudden dizziness and flushing while driving. He pulled over and went to [employer] [facility] [facility] food to see if this would relieve his symptoms but they persisted. A fireman walked by and checked his pulse and it was noted to be in the 30's, therefore he called EMS.   He denies any associated presyncope. Did have a syncopal episode in 2016 which he reports was attributed to hypotension. Echocardiogram was performed and showed a normal EF of 60-65% with Grade 1 DD and no wall motion abnormalities.   Labs are in the process of being obtained. Results thus far show a WBC of 6.5, Hgb 15.0, and platelets 189. K+ 3.4 and creatinine 1.19. Troponin pending. CXR shows no acute cardiopulmonary abnormalities. EKG shows complete heart block with HR of 41.    Past Medical History    Past Medical History:  Diagnosis Date  . High cholesterol   . History of left bundle branch block (LBBB) 05/01/2015  . Hypertension   . 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 BACK SURGERY    . NOSE SURGERY       Allergies  No Known Allergies   Home Medications    Prior to Admission medications   Medication Sig Start Date End Date Taking? Authorizing Provider  acetaminophen (TYLENOL) 325 MG tablet Take 650 mg by mouth every evening.    Historical Provider, MD  Docusate Calcium (STOOL SOFTENER PO) Take 1 tablet by mouth daily.    Historical  Provider, MD  lisinopril (PRINIVIL,ZESTRIL) 40 MG tablet Take 1 tablet (40 mg total) by mouth daily. 05/02/15   Rodolph Bonganiel V Thompson, MD  loratadine (CLARITIN) 10 MG tablet Take 10 mg by mouth daily.    Historical Provider, MD  Multiple Vitamins-Minerals (MULTIVITAMIN ADULTS 50+) TABS Take 1 tablet by mouth daily.    Historical Provider, MD  simvastatin (ZOCOR) 40 MG tablet Take 40 mg by mouth daily. 04/02/15   Historical Provider, MD    Family History    Family History  Problem Relation Age of Onset  . Heart failure Mother     Social History    Social History   Social History  . Marital status: Married    Spouse name: N/A  . Number of children: N/A  . Years of education: N/A   Occupational History  . Not on file.   Social History Main Topics  . Smoking status: Never Smoker  . Smokeless tobacco: Never Used  . Alcohol use No  . Drug use: Unknown  . Sexual activity: Not on file   Other Topics Concern  . Not on file   Social History Narrative  . No narrative on file     Review of Systems    General:  No chills, fever, night sweats or weight changes. Positive for dizziness and flushing.  Cardiovascular:  No chest pain, dyspnea  on exertion, edema, orthopnea, palpitations, paroxysmal nocturnal dyspnea. Dermatological: No rash, lesions/masses Respiratory: No cough, dyspnea Urologic: No hematuria, dysuria Abdominal:   No nausea, vomiting, diarrhea, bright red blood per rectum, melena, or hematemesis Neurologic:  No visual changes, wkns, changes in mental status. All other systems reviewed and are otherwise negative except as noted above.  Physical Exam    Blood pressure 153/86, pulse (!) 40, temperature 98.2 F (36.8 C), temperature source Oral, resp. rate 18, SpO2 100 %.  General: Well developed, well nourished, Caucasian male in no acute distress. Head: Normocephalic, atraumatic, sclera non-icteric, no xanthomas, nares are without discharge. Dentition:  Neck: No carotid  bruits. JVD not elevated.  Lungs: Respirations regular and unlabored, without wheezes or rales.  Heart: Regular rhythm, bradycardiac rate. No S3 or S4.  No murmur, no rubs, or gallops appreciated. Abdomen: Soft, non-tender, non-distended with normoactive bowel sounds. No hepatomegaly. No rebound/guarding. No obvious abdominal masses. Msk:  Strength and tone appear normal for age. No joint deformities or effusions. Extremities: No clubbing or cyanosis. No edema.  Distal pedal pulses are 2+ bilaterally. Neuro: Alert and oriented X 3. Moves all extremities spontaneously. No focal deficits noted. Psych:  Responds to questions appropriately with a normal affect. Skin: No rashes or lesions noted  Labs    Troponin (Point of Care Test) No results for input(s): TROPIPOC in the last 72 hours.  Recent Labs  09/14/16 1331  TROPONINI <0.03   Lab Results  Component Value Date   WBC 6.5 09/14/2016   HGB 14.3 09/14/2016   HCT 42.0 09/14/2016   MCV 89.4 09/14/2016   PLT 189 09/14/2016     Recent Labs Lab 09/14/16 1331 09/14/16 1414  NA 138 138  K 3.5 3.4*  CL 104 101  CO2 25  --   BUN 9 12  CREATININE 1.01 0.90  CALCIUM 9.1  --   PROT 6.4*  --   BILITOT 0.8  --   ALKPHOS 87  --   ALT 27  --   AST 31  --   GLUCOSE 158* 157*    Radiology Studies    Dg Chest Port 1 View  Result Date: 09/14/2016 CLINICAL DATA:  Near syncopal episode. Patient found to be bradycardic prior to coming to the emergency department. EXAM: PORTABLE CHEST 1 VIEW COMPARISON:  PA and lateral chest x-ray of April 30, 2015 FINDINGS: The lungs are well-expanded and clear. External pacemaker defibrillator pads are present on the left. The heart and pulmonary vascularity are normal. The mediastinum is normal in width. There is faint calcification in the wall of the aortic arch. The bony thorax exhibits no acute abnormality. IMPRESSION: There is no acute cardiopulmonary abnormality. Electronically Signed   By: David   [place]azil[place]Jordan M.D.   On: 09/14/2016 13:52    EKG & Cardiac Imaging    EKG: Complete heart block with HR of 41  ECHOCARDIOGRAM: 05/02/2015 Study Conclusions  - Left ventricle: The cavity size was normal. Wall thickness was   increased in a pattern of mild LVH. Systolic function was normal.   The estimated ejection fraction was in the range of 60% to 65%.   Doppler parameters are consistent with abnormal left ventricular   relaxation (grade 1 diastolic dysfunction).  Assessment & Plan    1. Complete heart block (HCC) - developed acute flushing and dizziness earlier today. Found to have a HR in the 30's upon EMS arrival.  - Labs thus far show a WBC of 6.5, Hgb 15.0, and  platelets 189. K+ 3.4 and creatinine 1.19. Troponin pending. CXR shows no acute cardiopulmonary abnormalities.  - EKG shows complete heart block with HR of 41. - Echocardiogram in 05/2015 showed  a normal EF of 60-65% with Grade 1 DD and no wall motion abnormalities.  - would benefit from PPM placement in the setting of complete heart block. Will discuss with EP.   2. LBBB (left bundle branch block) - chronic, noted on previous tracings.   3. HTN - continue PTA Lisinopril.   4. HLD - continue statin therapy.   Signed, Ellsworth Lennox, PA-C 09/14/2016, 2:52 PM Pager: [phone]

## 2016-09-14 NOTE — Consult Note (Signed)
ELECTROPHYSIOLOGY CONSULT NOTE    Patient ID: Marvin Padilla H Weisensel MRN: [id number], DOB/AGE: Jun 10, 1941 75 y.o.  Admit date: 09/14/2016 Date of Consult: 09/14/2016   Primary Physician: Enrique SackGREEN, [name], MD Primary Cardiologist: new  Requesting MD: Dr. Excell Seltzerooper  Reason for Consultation: CHB  HPI: Marvin Padilla H Vanderslice is a 75 y.o. male with PMHx only of HTN and HLD [facility] with near syncope, bradycardia.  The patient this morning woke feeling well, did his usual exercise with water aerobics without difficulty, had breakfast and was doing some errands.  He was driving and suddenly became flushed and nearly fainted.  He took some dep breaths and though better still weak.  He pulled into [employer] to grab a bite to eat, while there again felt poorly, a firefighter happened to be in [employer] and he found his pulse slow, EMS was called and he was transported here.  He denies any new medicines, no recent illness, no N/V/D, reports he has been feeling well until today.  He has not had any CP, palpitations or SOB.  He had a syncopal event about a year ago, was evaluated here felt to be secondary to orthostasis and his medicines adjusted.  LABS: K+ 3.5, 3.4 BUN/Creat 12/0.90 Trop I: <0.03 H/H 15/42 WBC 6.5 Plts 189  Past Medical History:  Diagnosis Date  . High cholesterol   . History of left bundle branch block (LBBB) 05/01/2015  . Hypertension   . 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 BACK SURGERY    . NOSE SURGERY        (Not in a hospital admission)  Inpatient Medications:   Allergies: No Known Allergies  Social History   Social History  . Marital status: Married    Spouse name: N/A  . Number of children: N/A  . Years of education: N/A   Occupational History  . Not on file.   Social History Main Topics  . Smoking status: Never Smoker  .  Smokeless tobacco: Never Used  . Alcohol use No  . Drug use: Unknown  . Sexual activity: Not on file   Other Topics Concern  . Not on file   Social History Narrative  . No narrative on file     Family History  Problem Relation Age of Onset  . Heart failure Mother      Review of Systems: All other systems reviewed and are otherwise negative except as noted above.  Physical Exam: Vitals:   09/14/16 1335  BP: 153/86  Pulse: (!) 40  Resp: 18  Temp: 98.2 F (36.8 C)  TempSrc: Oral  SpO2: 100%    GEN- The patient is well appearing, alert and oriented x 3 today.   HEENT: normocephalic, atraumatic; sclera clear, conjunctiva pink; hearing intact; oropharynx clear; neck supple, no JVP Lymph- no cervical lymphadenopathy Lungs- Clear to ausculation bilaterally, normal work of breathing.  No wheezes, rales, rhonchi Heart- Regular rate and rhythm, bradycardic, no murmurs, rubs or gallops, PMI not laterally displaced GI- soft, non-tender, non-distended Extremities- no clubbing, cyanosis, or edema, warm MS- no significant deformity or atrophy Skin- warm and dry, no rash or lesion Psych- euthymic mood, full affect Neuro- no gross deficits observed  Labs:   Lab Results  Component Value Date   WBC 6.5 09/14/2016   HGB 14.3 09/14/2016   HCT 42.0 09/14/2016  MCV 89.4 09/14/2016   PLT 189 09/14/2016    Recent Labs Lab 09/14/16 1331 09/14/16 1414  NA 138 138  K 3.5 3.4*  CL 104 101  CO2 25  --   BUN 9 12  CREATININE 1.01 0.90  CALCIUM 9.1  --   PROT 6.4*  --   BILITOT 0.8  --   ALKPHOS 87  --   ALT 27  --   AST 31  --   GLUCOSE 158* 157*      Radiology/Studies:  Dg Chest Port 1 View Result Date: 09/14/2016 CLINICAL DATA:  Near syncopal episode. Patient found to be bradycardic prior to coming to the emergency department. EXAM: PORTABLE CHEST 1 VIEW COMPARISON:  PA and lateral chest x-ray of April 30, 2015 FINDINGS: The lungs are well-expanded and clear. External  pacemaker defibrillator pads are present on the left. The heart and pulmonary vascularity are normal. The mediastinum is normal in width. There is faint calcification in the wall of the aortic arch. The bony thorax exhibits no acute abnormality. IMPRESSION: There is no acute cardiopulmonary abnormality. Electronically Signed   By: David  SwazilandJordan M.D.   On: 09/14/2016 13:52    EKG: CHB, V rate 41, IVCD 05/01/15: SR, LBBB, borderline 1st degree AVblock TELEMETRY: CHB 30's-40's 05/02/15: TTE Study Conclusions - Left ventricle: The cavity size was normal. Wall thickness was   increased in a pattern of mild LVH. Systolic function was normal.   The estimated ejection fraction was in the range of 60% to 65%.   Doppler parameters are consistent with abnormal left ventricular   relaxation (grade 1 diastolic dysfunction).  Only mild VHD described  Assessment and Plan:   1. Near syncope, CHB     Not on any nodal blocking/rate limiting home meds     Recommend PPM     Risks/benefits discussed with the patient and he is agreeable to proceed     Temp wire if necessary  2. HTN     BP stable    Signed, Francis DowseRenee Ursuy, PA-C 09/14/2016 3:23 PM  EP Attending  Patient seen and examined. See the additional note. Will plan to proceed with urgent PPM insertion. Patient has had 10 seconds of asystole on my exam.  Leonia ReevesGregg Taylor,M.D.

## 2016-09-14 NOTE — ED Triage Notes (Signed)
[facility] to [facility] - pt reports driving and beginning to feel "flushed and dizzy" so [place] [employer] and thought if he got something to eat he would feel better. While there saw a fireman and mentioned to him he did not feel well, pulse found to be 36. EMS was activated, EMS reports hr 60-100, when rolling into ED HR dropped to 40's, patient became flushed. All other vitals are stable, patient is a/o x4. Hx of atrial fibrillation and LBBB.

## 2016-09-14 NOTE — ED Provider Notes (Signed)
[facility]-EMERGENCY DEPT Provider Note   CSN: [id number] Arrival date & time: 09/14/16  1319     History   Chief Complaint Chief Complaint  Patient presents with  . Bradycardia    HPI Marvin Padilla is a 75 y.o. male.  Patient c/o feeling faint, lightheaded, flushed, while at restaurant today. Symptoms were acute onset, moderate, persistent, without specific exacerbating or alleviating factors.   Had exercised earlier today and felt normal. EMS noted HR in 30's. Patient denies hx dysrhythmia. No recent change in meds. No current or recent chest pain or discomfort. No sob. No syncope.    The history is provided by the patient.    Past Medical History:  Diagnosis Date  . High cholesterol   . History of left bundle branch block (LBBB) 05/01/2015  . Hypertension   . 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 Orthostasis 05/01/2015  . History of left bundle branch block (LBBB) 05/01/2015  . Essential hypertension   . Syncope 04/30/2015  . Bradycardia 04/30/2015  . Syncope and collapse 04/30/2015  . High cholesterol   . Hypertension   . TBI (traumatic brain injury) (HCC)   . Seizures (HCC)     Past Surgical History:  Procedure Laterality Date  . BACK SURGERY    . NOSE SURGERY         Home Medications    Prior to Admission medications   Medication Sig Start Date End Date Taking? Authorizing Provider  acetaminophen (TYLENOL) 325 MG tablet Take 650 mg by mouth every evening.    Historical Provider, MD  Docusate Calcium (STOOL SOFTENER PO) Take 1 tablet by mouth daily.    Historical Provider, MD  lisinopril (PRINIVIL,ZESTRIL) 40 MG tablet Take 1 tablet (40 mg total) by mouth daily. 05/02/15   Rodolph Bonganiel V Thompson, MD  loratadine (CLARITIN) 10 MG tablet Take 10 mg by mouth daily.    Historical Provider, MD  Multiple Vitamins-Minerals (MULTIVITAMIN ADULTS 50+) TABS Take 1 tablet by mouth  daily.    Historical Provider, MD  simvastatin (ZOCOR) 40 MG tablet Take 40 mg by mouth daily. 04/02/15   Historical Provider, MD    Family History History reviewed. No pertinent family history.  Social History Social History  Substance Use Topics  . Smoking status: Never Smoker  . Smokeless tobacco: Never Used  . Alcohol use No     Allergies   Patient has no known allergies.   Review of Systems Review of Systems  Constitutional: Negative for fever.  HENT: Negative for sore throat.   Eyes: Negative for redness.  Respiratory: Negative for shortness of breath.   Cardiovascular: Negative for chest pain.  Gastrointestinal: Negative for abdominal pain.  Genitourinary: Negative for flank pain.  Musculoskeletal: Negative for back pain and neck pain.  Skin: Negative for rash.  Neurological: Positive for light-headedness. Negative for weakness and numbness.  Hematological: Does not bruise/bleed easily.  Psychiatric/Behavioral: Negative for confusion.     Physical Exam Updated Vital Signs BP 153/86 (BP Location: Right Arm)   Pulse (!) 40   Temp 98.2 F (36.8 C) (Oral)   Resp 18   SpO2 100%   Physical Exam  Constitutional: He appears well-developed and well-nourished. No distress.  HENT:  Mouth/Throat: Oropharynx is clear and moist.  Eyes: Conjunctivae are normal.  Neck: Neck supple. No tracheal deviation present. No thyromegaly present.  Cardiovascular: Regular rhythm, normal heart  sounds and intact distal pulses.   Bradycardic.   Pulmonary/Chest: Effort normal. No accessory muscle usage. No respiratory distress.  Abdominal: Soft. He exhibits no distension. There is no tenderness.  Musculoskeletal: He exhibits no edema.  Neurological: He is alert.  Skin: Skin is warm and dry. He is not diaphoretic.  Psychiatric: He has a normal mood and affect.  Nursing note and vitals reviewed.    ED Treatments / Results  Labs (all labs ordered are listed, but only abnormal  results are displayed) Results for orders placed or performed during the hospital encounter of 09/14/16  CBC  Result Value Ref Range   WBC 6.5 4.0 - 10.5 K/uL   RBC 4.80 4.22 - 5.81 MIL/uL   Hemoglobin 15.0 13.0 - 17.0 g/dL   HCT 16.1 09.6 - 04.5 %   MCV 89.4 78.0 - 100.0 fL   MCH 31.3 26.0 - 34.0 pg   MCHC 35.0 30.0 - 36.0 g/dL   RDW 40.9 81.1 - 91.4 %   Platelets 189 150 - 400 K/uL  Comprehensive metabolic panel  Result Value Ref Range   Sodium 138 135 - 145 mmol/L   Potassium 3.5 3.5 - 5.1 mmol/L   Chloride 104 101 - 111 mmol/L   CO2 25 22 - 32 mmol/L   Glucose, Bld 158 (H) 65 - 99 mg/dL   BUN 9 6 - 20 mg/dL   Creatinine, Ser 7.82 0.61 - 1.24 mg/dL   Calcium 9.1 8.9 - 95.6 mg/dL   Total Protein 6.4 (L) 6.5 - 8.1 g/dL   Albumin 4.0 3.5 - 5.0 g/dL   AST 31 15 - 41 U/L   ALT 27 17 - 63 U/L   Alkaline Phosphatase 87 38 - 126 U/L   Total Bilirubin 0.8 0.3 - 1.2 mg/dL   GFR calc non Af Amer >60 >60 mL/min   GFR calc Af Amer >60 >60 mL/min   Anion gap 9 5 - 15  Urinalysis, Routine w reflex microscopic  Result Value Ref Range   Color, Urine STRAW (A) YELLOW   APPearance CLEAR CLEAR   Specific Gravity, Urine 1.004 (L) 1.005 - 1.030   pH 7.0 5.0 - 8.0   Glucose, UA NEGATIVE NEGATIVE mg/dL   Hgb urine dipstick NEGATIVE NEGATIVE   Bilirubin Urine NEGATIVE NEGATIVE   Ketones, ur NEGATIVE NEGATIVE mg/dL   Protein, ur NEGATIVE NEGATIVE mg/dL   Nitrite NEGATIVE NEGATIVE   Leukocytes, UA NEGATIVE NEGATIVE  Troponin I  Result Value Ref Range   Troponin I <0.03 <0.03 ng/mL  I-stat chem 8, ed  Result Value Ref Range   Sodium 138 135 - 145 mmol/L   Potassium 3.4 (L) 3.5 - 5.1 mmol/L   Chloride 101 101 - 111 mmol/L   BUN 12 6 - 20 mg/dL   Creatinine, Ser 2.13 0.61 - 1.24 mg/dL   Glucose, Bld 086 (H) 65 - 99 mg/dL   Calcium, Ion 5.78 4.69 - 1.40 mmol/L   TCO2 26 0 - 100 mmol/L   Hemoglobin 14.3 13.0 - 17.0 g/dL   HCT 62.9 52.8 - 41.3 %   Dg Chest Port 1 View  Result Date:  09/14/2016 CLINICAL DATA:  Near syncopal episode. Patient found to be bradycardic prior to coming to the emergency department. EXAM: PORTABLE CHEST 1 VIEW COMPARISON:  PA and lateral chest x-ray of April 30, 2015 FINDINGS: The lungs are well-expanded and clear. External pacemaker defibrillator pads are present on the left. The heart and pulmonary vascularity are normal. The  mediastinum is normal in width. There is faint calcification in the wall of the aortic arch. The bony thorax exhibits no acute abnormality. IMPRESSION: There is no acute cardiopulmonary abnormality. Electronically Signed   By: David  SwazilandJordan M.D.   On: 09/14/2016 13:52    EKG  EKG Interpretation  Date/Time:  Friday September 14 2016 13:24:38 EST Ventricular Rate:  41 PR Interval:    QRS Duration: 151 QT Interval:  528 QTC Calculation: 436 R Axis:   41 Text Interpretation:  Complete AV block with wide QRS complex Nonspecific T wave abnormality Confirmed by Denton LankSTEINL  MD, Caryn BeeKEVIN ([id number]) on 09/14/2016 1:28:42 PM       Radiology No results found.  Procedures Procedures (including critical care time)  Medications Ordered in ED Medications  0.9 %  sodium chloride infusion (not administered)     Initial Impression / Assessment and Plan / ED Course  I have reviewed the triage vital signs and the nursing notes.  Pertinent labs & imaging results that were available during my care of the patient were reviewed by me and considered in my medical decision making (see chart for details).  Clinical Course     Iv NS. Continuous pulse ox and monitor. 02 Vashon.   Labs.   Cardiology consulted.   Reviewed nursing notes and prior charts for additional history.   Recheck, bp remains normally. Cardiology seeing patient for possible pacemaker placement.     Final Clinical Impressions(s) / ED Diagnoses   Final diagnoses:  None    New Prescriptions New Prescriptions   No medications on file     Cathren LaineKevin Remell Giaimo, MD 09/14/16  1524

## 2016-09-14 NOTE — Progress Notes (Signed)
Orthopedic Tech Progress Note Patient Details:  Daun Peacockorman H Carelock 09-15-1941 [id number]  Ortho Devices Type of Ortho Device: Arm sling Ortho Device/Splint Location: Pt already has Arm sling at this time. (ortho Tech).   Alvina ChouWilliams, Kiernan Atkerson C 09/14/2016, 6:25 PM

## 2016-09-14 NOTE — ED Notes (Signed)
Cardiology at bedside.

## 2016-09-14 NOTE — Discharge Instructions (Signed)
° ° °  Supplemental Discharge Instructions for  Pacemaker/Defibrillator Patients  Activity No heavy lifting or vigorous activity with your left/right arm for 6 to 8 weeks.  Do not raise your left/right arm above your head for one week.  Gradually raise your affected arm as drawn below.             09/18/16                  09/19/16                  09/20/16                 09/21/16 __  NO DRIVING until cleared at your wound check visit  WOUND CARE - Keep the wound area clean and dry.  Do not get this area wet for one week. No showers for one week; you may shower on 09/21/16   . - The tape/steri-strips on your wound will fall off; do not pull them off.  No bandage is needed on the site.  DO  NOT apply any creams, oils, or ointments to the wound area. - If you notice any drainage or discharge from the wound, any swelling or bruising at the site, or you develop a fever > 101? F after you are discharged home, call the office at once.  Special Instructions - You are still able to use cellular telephones; use the ear opposite the side where you have your pacemaker/defibrillator.  Avoid carrying your cellular phone near your device. - When traveling through airports, show security personnel your identification card to avoid being screened in the metal detectors.  Ask the security personnel to use the hand wand. - Avoid arc welding equipment, MRI testing (magnetic resonance imaging), TENS units (transcutaneous nerve stimulators).  Call the office for questions about other devices. - Avoid electrical appliances that are in poor condition or are not properly grounded. - Microwave ovens are safe to be near or to operate.  Additional information for defibrillator patients should your device go off: - If your device goes off ONCE and you feel fine afterward, notify the device clinic nurses. - If your device goes off ONCE and you do not feel well afterward, call 911. - If your device goes off TWICE, call  911. - If your device goes off THREE times in one day, call 911.  DO NOT DRIVE YOURSELF OR A FAMILY MEMBER WITH A DEFIBRILLATOR TO THE HOSPITAL--CALL 911.

## 2016-09-15 ENCOUNTER — Inpatient Hospital Stay (HOSPITAL_COMMUNITY): Payer: Medicare Other

## 2016-09-15 ENCOUNTER — Encounter (HOSPITAL_COMMUNITY): Payer: Self-pay | Admitting: Physician Assistant

## 2016-09-15 DIAGNOSIS — Z950 Presence of cardiac pacemaker: Secondary | ICD-10-CM | POA: Diagnosis present

## 2016-09-15 LAB — BASIC METABOLIC PANEL
Anion gap: 8 (ref 5–15)
BUN: 13 mg/dL (ref 6–20)
CHLORIDE: 104 mmol/L (ref 101–111)
CO2: 24 mmol/L (ref 22–32)
CREATININE: 1.02 mg/dL (ref 0.61–1.24)
Calcium: 8.7 mg/dL — ABNORMAL LOW (ref 8.9–10.3)
GFR calc Af Amer: >60 mL/min (ref >60)
GFR calc non Af Amer: >60 mL/min (ref >60)
Glucose, Bld: 135 mg/dL — ABNORMAL HIGH (ref 65–99)
POTASSIUM: 3.6 mmol/L (ref 3.5–5.1)
Sodium: 136 mmol/L (ref 135–145)

## 2016-09-15 LAB — CBC
HEMATOCRIT: 40.5 % (ref 39.0–52.0)
Hemoglobin: 14.2 g/dL (ref 13.0–17.0)
MCH: 31.3 pg (ref 26.0–34.0)
MCHC: 35.1 g/dL (ref 30.0–36.0)
MCV: 89.4 fL (ref 78.0–100.0)
PLATELETS: 160 10*3/uL (ref 150–400)
RBC: 4.53 MIL/uL (ref 4.22–5.81)
RDW: 12.8 % (ref 11.5–15.5)
WBC: 5.7 10*3/uL (ref 4.0–10.5)

## 2016-09-15 NOTE — Progress Notes (Signed)
Patient ID: Marvin Peacockorman H Mcnally, male   DOB: 1940/11/19, 75 y.o.   MRN: [id number]   Patient Name: Marvin Padilla Date of Encounter: 09/15/2016  [facility]  Hospital Problem List     Principal Problem:   Complete heart block [facility](HCC) Active Problems:   LBBB (left bundle branch block)   Pacemaker     Subjective   No complaints   Inpatient Medications    Scheduled Meds: .  ceFAZolin (ANCEF) IV  1 g Intravenous Q6H  . enoxaparin (LOVENOX) injection  40 mg Subcutaneous Q24H  . lisinopril  40 mg Oral Daily  . simvastatin  40 mg Oral q1800   Continuous Infusions: . sodium chloride 20 mL/hr at 09/14/16 2100   PRN Meds: acetaminophen, ondansetron (ZOFRAN) IV   Vital Signs    Vitals:   09/14/16 1924 09/14/16 2100 09/15/16 0458 09/15/16 0700  BP: (!) 159/98 138/72 139/78 128/81  Pulse: 69 70 64 68  Resp: 18 19 16  (!) 23  Temp: 97.2 F (36.2 C)  97.4 F (36.3 C) 98.1 F (36.7 C)  TempSrc: Oral  Axillary Oral  SpO2: 98% 96% 98% 96%  Weight:   213 lb 6.5 oz (96.8 kg)     Intake/Output Summary (Last 24 hours) at 09/15/16 0753 Last data filed at 09/15/16 0651  Gross per 24 hour  Intake              890 ml  Output             1250 ml  Net             -360 ml   Filed Weights   09/15/16 0458  Weight: 213 lb 6.5 oz (96.8 kg)    Physical Exam    GEN: Well nourished, well developed, in no acute distress.  HEENT: Grossly normal.  Neck: Supple, no JVD, carotid bruits, or masses. Cardiac: RRR, no murmurs, rubs, or gallops. No clubbing, cyanosis, edema.  Radials/DP/PT 2+ and equal bilaterally.  Respiratory:  Respirations regular and unlabored, clear to auscultation bilaterally. GI: Soft, nontender, nondistended, BS + x 4. MS: no deformity or atrophy. Skin: warm and dry, no rash. Neuro:  Strength and sensation are intact. Psych: AAOx3.  Normal affect. Pacer under left clavicle A no hematoma  Labs    CBC  Recent Labs  09/14/16 1331 09/14/16 1414  09/15/16 0227  WBC 6.5  --  5.7  HGB 15.0 14.3 14.2  HCT 42.9 42.0 40.5  MCV 89.4  --  89.4  PLT 189  --  160   Basic Metabolic Panel  Recent Labs  09/14/16 1331 09/14/16 1414 09/15/16 0227  NA 138 138 136  K 3.5 3.4* 3.6  CL 104 101 104  CO2 25  --  24  GLUCOSE 158* 157* 135*  BUN 9 12 13   CREATININE 1.01 0.90 1.02  CALCIUM 9.1  --  8.7*   Liver Function Tests  Recent Labs  09/14/16 1331  AST 31  ALT 27  ALKPHOS 87  BILITOT 0.8  PROT 6.4*  ALBUMIN 4.0   No results for input(s): LIPASE, AMYLASE in the last 72 hours. Cardiac Enzymes  Recent Labs  09/14/16 1331  TROPONINI <0.03   BNP Invalid input(s): POCBNP D-Dimer No results for input(s): DDIMER in the last 72 hours. Hemoglobin A1C No results for input(s): HGBA1C in the last 72 hours. Fasting Lipid Panel No results for input(s): CHOL, HDL, LDLCALC, TRIG, CHOLHDL, LDLDIRECT in the last 72 hours. Thyroid  Function Tests No results for input(s): TSH, T4TOTAL, T3FREE, THYROIDAB in the last 72 hours.  Invalid input(s): FREET3  Telemetry    NSR pacing  - Personally Reviewed  ECG     A pacing LBBB native - Personally Reviewed  Radiology    Dg Chest 2 View  Result Date: 09/15/2016 CLINICAL DATA:  75 year old male status post pacemaker placement. EXAM: CHEST  2 VIEW COMPARISON:  Chest radiograph dated 09/14/2016 FINDINGS: There has been interval placement of a dual lead pacemaker device with generator in the left upper chest. There is slight focal kinking of the pacemaker wires under the third rib. The wires however remain intact. The lungs are clear. There is no pleural effusion or pneumothorax. The cardiac silhouette is within normal limits. There is osteopenia with degenerative changes of the spine. No acute fracture. The IMPRESSION: Interval placement of a dual lead pacemaker device. Slight focal tilting of the pacemaker wires under the third rib. The wires are otherwise intact. No pneumothorax.  Electronically Signed   By: Elgie CollardArash  Radparvar M.D.   On: 09/15/2016 07:17   Dg Chest Port 1 View  Result Date: 09/14/2016 CLINICAL DATA:  Near syncopal episode. Patient found to be bradycardic prior to coming to the emergency department. EXAM: PORTABLE CHEST 1 VIEW COMPARISON:  PA and lateral chest x-ray of April 30, 2015 FINDINGS: The lungs are well-expanded and clear. External pacemaker defibrillator pads are present on the left. The heart and pulmonary vascularity are normal. The mediastinum is normal in width. There is faint calcification in the wall of the aortic arch. The bony thorax exhibits no acute abnormality. IMPRESSION: There is no acute cardiopulmonary abnormality. Electronically Signed   By: David  SwazilandJordan M.D.   On: 09/14/2016 13:52    Cardiac Studies   Echo normal EF 60-65%   Patient Profile     75 y.o. with presyncope and CHB post pacer   Assessment & Plan    Pacer: pocket A spoke with Leta JunglingMarcia normal parameters CXR ok d/c home outpatient F/u with Dr Ladona Ridgelaylor  Signed, Charlton HawsPeter Michaelene Dutan, MD  09/15/2016, 7:53 AM

## 2016-09-15 NOTE — Discharge Summary (Signed)
Discharge Summary    Patient ID: Marvin Peacockorman H Cheek,  MRN: [id number], DOB/AGE: 05-15-1941 75 y.o.  Admit date: 09/14/2016 Discharge date: 09/15/2016  Primary Care Provider: Enrique SackGREEN, [name] Primary Cardiologist: Dr Ladona Ridgelaylor   Discharge Diagnoses   Principal Problem:   Complete heart block s/p Pacemaker PM Active Problems:   High cholesterol   LBBB (left bundle branch block)   Essential hypertension   Allergies No Known Allergies   History of Present Illness     Marvin Padilla is a 75 y.o. male with past medical history of HTN, HLD, and known LBBB who [facility] for evaluation of flushing and dizziness. Found to be in CHB.  The patient reports he was in his usual state of health until lunchtime on 09/14/16 when he developed a sudden dizziness and flushing while driving. [facility] pulled [facility] to see if this would relieve his symptoms but they persisted. A fireman walked by and checked his pulse and it was noted to be in the 30's, therefore he called EMS.   He denies any associated presyncope. Did have a syncopal episode in 2016 which he reports was attributed to hypotension. Echocardiogram was performed and showed a normal EF of 60-65% with Grade 1 DD and no wall motion abnormalities.   EKG in ER showed complete heart block with HR of 41. Labwork showed CBC, electrolytes and kidney function WNL.  Hospital Course     Consultants: none  1. Near syncope, CHB: with no reversible causes. Not on any nodal blocking/rate limiting medications. Brought back for PPM on 09/14/16 by Dr. Ladona Ridgelaylor with a 09/14/16 with a Medtronic (serial number [id number]) pacemaker. CXR this AM with no PTX and device interrogation was normal. Wound check in 10 days scheduled.   2. HTN: BP stable  The patient has had an uncomplicated hospital course and is recovering well. He has been seen by Dr. Eden EmmsNishan today and deemed ready for discharge home.  All follow-up appointments have been scheduled. Discharge medications are listed below.  _____________  Discharge Vitals Blood pressure 128/81, pulse 68, temperature 98.1 F (36.7 C), temperature source Oral, resp. rate (!) 23, weight 213 lb 6.5 oz (96.8 kg), SpO2 96 %.  Filed Weights   09/15/16 0458  Weight: 213 lb 6.5 oz (96.8 kg)     Labs & Radiologic Studies     CBC  Recent Labs  09/14/16 1331 09/14/16 1414 09/15/16 0227  WBC 6.5  --  5.7  HGB 15.0 14.3 14.2  HCT 42.9 42.0 40.5  MCV 89.4  --  89.4  PLT 189  --  160   Basic Metabolic Panel  Recent Labs  09/14/16 1331 09/14/16 1414 09/15/16 0227  NA 138 138 136  K 3.5 3.4* 3.6  CL 104 101 104  CO2 25  --  24  GLUCOSE 158* 157* 135*  BUN 9 12 13   CREATININE 1.01 0.90 1.02  CALCIUM 9.1  --  8.7*   Liver Function Tests  Recent Labs  09/14/16 1331  AST 31  ALT 27  ALKPHOS 87  BILITOT 0.8  PROT 6.4*  ALBUMIN 4.0   No results for input(s): LIPASE, AMYLASE in the last 72 hours. Cardiac Enzymes  Recent Labs  09/14/16 1331  TROPONINI <0.03   BNP Invalid input(s): POCBNP D-Dimer No results for input(s): DDIMER in the last 72 hours. Hemoglobin A1C No results for input(s): HGBA1C in the last 72 hours.  Fasting Lipid Panel No results for input(s): CHOL, HDL, LDLCALC, TRIG, CHOLHDL, LDLDIRECT in the last 72 hours. Thyroid Function Tests No results for input(s): TSH, T4TOTAL, T3FREE, THYROIDAB in the last 72 hours.  Invalid input(s): FREET3  Dg Chest 2 View  Result Date: 09/15/2016 CLINICAL DATA:  75 year old male status post pacemaker placement. EXAM: CHEST  2 VIEW COMPARISON:  Chest radiograph dated 09/14/2016 FINDINGS: There has been interval placement of a dual lead pacemaker device with generator in the left upper chest. There is slight focal kinking of the pacemaker wires under the third rib. The wires however remain intact. The lungs are clear. There is no pleural effusion or pneumothorax. The  cardiac silhouette is within normal limits. There is osteopenia with degenerative changes of the spine. No acute fracture. The IMPRESSION: Interval placement of a dual lead pacemaker device. Slight focal tilting of the pacemaker wires under the third rib. The wires are otherwise intact. No pneumothorax. Electronically Signed   By: Elgie Collard M.D.   On: 09/15/2016 07:17   Dg Chest Port 1 View  Result Date: 09/14/2016 CLINICAL DATA:  Near syncopal episode. Patient found to be bradycardic prior to coming to the emergency department. EXAM: PORTABLE CHEST 1 VIEW COMPARISON:  PA and lateral chest x-ray of April 30, 2015 FINDINGS: The lungs are well-expanded and clear. External pacemaker defibrillator pads are present on the left. The heart and pulmonary vascularity are normal. The mediastinum is normal in width. There is faint calcification in the wall of the aortic arch. The bony thorax exhibits no acute abnormality. IMPRESSION: There is no acute cardiopulmonary abnormality. Electronically Signed   By: David  [place]iland M.D.   On: 09/14/2016 13:52     Diagnostic Studies/Procedures    09/14/16 PPM insertion Conclusion  CONCLUSIONS:   1. Successful implantation of a Medtronic dual-chamber pacemaker for symptomatic bradycardia due to complete heart block  2. No early apparent complications.       CXR: 09/15/16 IMPRESSION: Interval placement of a dual lead pacemaker device. Slight focal tilting of the pacemaker wires under the third rib. The wires are otherwise intact. No pneumothorax.  05/02/15: TTE Study Conclusions - Left ventricle: The cavity size was normal. Wall thickness was increased in a pattern of mild LVH. Systolic function was normal. The estimated ejection fraction was in the range of 60% to 65%. Doppler parameters are consistent with abnormal left ventricular relaxation (grade 1 diastolic dysfunction).   Disposition   Pt is being discharged home today in good  condition.  Follow-up Plans & Appointments    Follow-up Information    [facility] [address] [facility] Follow up on 09/27/2016.   Specialty:  Cardiology Why:  11:00AM, wound check Contact information: [address], [address] [place] [address] [phone]       Lewayne Bunting, MD Follow up on 12/18/2016.   Specialty:  Cardiology Why:  2:15PM Contact information: [address] [place] [address] [phone]            Discharge Medications     Medication List    TAKE these medications   amLODipine 5 MG tablet Commonly known as:  NORVASC Take 2.5 mg by mouth daily.   diphenhydramine-acetaminophen 25-500 MG Tabs tablet Commonly known as:  TYLENOL PM Take 1 tablet by mouth at bedtime.   docusate sodium 100 MG capsule Commonly known as:  COLACE Take 100 mg by mouth daily.   lisinopril-hydrochlorothiazide 20-25 MG tablet Commonly known as:  PRINZIDE,ZESTORETIC  Take 1 tablet by mouth daily.   loratadine 10 MG tablet Commonly known as:  CLARITIN Take 10 mg by mouth daily as needed for allergies or itching.   multivitamin with minerals Tabs tablet Take 1 tablet by mouth daily.   simvastatin 40 MG tablet Commonly known as:  ZOCOR Take 40 mg by mouth daily.   triamcinolone cream 0.1 % Commonly known as:  KENALOG Apply 1 application topically daily as needed (itching/ rash).         Outstanding Labs/Studies   Wound check  Duration of Discharge Encounter   Greater than 30 minutes including physician time.  Signed, Cline CrockKathryn Gesselle Fitzsimons PA-C 09/15/2016, 8:00 AM

## 2016-09-15 NOTE — Progress Notes (Signed)
Patient Name: Marvin Padilla H Chaikin      SUBJECTIVE:*ADmitted with syncope with LBBB and now CHB  Prev normal LV function Underwent pacing yday   Past Medical History:  Diagnosis Date  . High cholesterol   . History of left bundle branch block (LBBB) 05/01/2015  . Hypertension   . Pacemaker    a. 09/14/16: CHB s/p PPM: Medtronic (serial number [id number]) pacemaker  . TBI (traumatic brain injury) (HCC) 1964   unconscious for two weeks    Scheduled Meds:  Scheduled Meds: .  ceFAZolin (ANCEF) IV  1 g Intravenous Q6H  . enoxaparin (LOVENOX) injection  40 mg Subcutaneous Q24H  . lisinopril  40 mg Oral Daily  . simvastatin  40 mg Oral q1800   Continuous Infusions: . sodium chloride 20 mL/hr at 09/14/16 2100   acetaminophen, ondansetron (ZOFRAN) IV    PHYSICAL EXAM Vitals:   09/14/16 1924 09/14/16 2100 09/15/16 0458 09/15/16 0700  BP: (!) 159/98 138/72 139/78 128/81  Pulse: 69 70 64 68  Resp: 18 19 16  (!) 23  Temp: 97.2 F (36.2 C)  97.4 F (36.3 C) 98.1 F (36.7 C)  TempSrc: Oral  Axillary Oral  SpO2: 98% 96% 98% 96%  Weight:   213 lb 6.5 oz (96.8 kg)   Pocket without  hematoma, swelling or tenderness  TELEMETRY: Reviewed personnally pt in P-synchronous/ AV  pacing :    Intake/Output Summary (Last 24 hours) at 09/15/16 0751 Last data filed at 09/15/16 0651  Gross per 24 hour  Intake              890 ml  Output             1250 ml  Net             -360 ml    LABS: Basic Metabolic Panel:  Recent Labs Lab 09/14/16 1331 09/14/16 1414 09/15/16 0227  NA 138 138 136  K 3.5 3.4* 3.6  CL 104 101 104  CO2 25  --  24  GLUCOSE 158* 157* 135*  BUN 9 12 13   CREATININE 1.01 0.90 1.02  CALCIUM 9.1  --  8.7*   Cardiac Enzymes:  Recent Labs  09/14/16 1331  TROPONINI <0.03   CBC:  Recent Labs Lab 09/14/16 1331 09/14/16 1414 09/15/16 0227  WBC 6.5  --  5.7  HGB 15.0 14.3 14.2  HCT 42.9 42.0 40.5  MCV 89.4  --  89.4  PLT 189  --  160    PROTIME: No results for input(s): LABPROT, INR in the last 72 hours. Liver Function Tests:  Recent Labs  09/14/16 1331  AST 31  ALT 27  ALKPHOS 87  BILITOT 0.8  PROT 6.4*  ALBUMIN 4.0   No results for input(s): LIPASE, AMYLASE in the last 72 hours. BNP: BNP (last 3 results) No results for input(s): BNP in the last 8760 hours.  ProBNP (last 3 results) No results for input(s): PROBNP in the last 8760 hours.  D-Dimer: No results for input(s): DDIMER in the last 72 hours. Hemoglobin A1C: No results for input(s): HGBA1C in the last 72 hours. Fasting Lipid Panel: No results for input(s): CHOL, HDL, LDLCALC, TRIG, CHOLHDL, LDLDIRECT in the last 72 hours. Thyroid Function Tests: No results for input(s): TSH, T4TOTAL, T3FREE, THYROIDAB in the last 72 hours.  Invalid input(s): FREET3 Anemia Panel: No results for input(s): VITAMINB12, FOLATE, FERRITIN, TIBC, IRON, RETICCTPCT in the last 72 hours.  Chest Xray personally  reviewed  Some V lead retraciton otherwise ok  Device InterrogatioN  NORMAL DEVICE FUNCTION   ASSESSMENT AND PLAN:  Principal Problem:   Complete heart block (HCC) Active Problems:   LBBB (left bundle branch block)   Pacemaker  insturctions given  Signed, Sherryl MangesSteven Klein MD  09/15/2016

## 2016-09-17 ENCOUNTER — Encounter (HOSPITAL_COMMUNITY): Payer: Self-pay | Admitting: Internal Medicine

## 2016-09-27 ENCOUNTER — Ambulatory Visit (INDEPENDENT_AMBULATORY_CARE_PROVIDER_SITE_OTHER): Payer: Medicare Other | Admitting: *Deleted

## 2016-09-27 DIAGNOSIS — I442 Atrioventricular block, complete: Secondary | ICD-10-CM | POA: Diagnosis not present

## 2016-09-27 LAB — CUP PACEART INCLINIC DEVICE CHECK
Brady Statistic AP VS Percent: 20.55 %
Brady Statistic AS VP Percent: 0.03 %
Brady Statistic RV Percent Paced: 0.06 %
Date Time Interrogation Session: 20171228123732
Implantable Lead Implant Date: 20171215
Implantable Lead Location: 753860
Implantable Lead Model: 5076
Lead Channel Impedance Value: 418 Ohm
Lead Channel Pacing Threshold Pulse Width: 0.4 ms
Lead Channel Sensing Intrinsic Amplitude: 6 mV
Lead Channel Setting Pacing Amplitude: 3.5 V
Lead Channel Setting Pacing Pulse Width: 0.4 ms
MDC IDC LEAD IMPLANT DT: 20171215
MDC IDC LEAD LOCATION: 753859
MDC IDC MSMT BATTERY VOLTAGE: 3.12 V
MDC IDC MSMT LEADCHNL RA IMPEDANCE VALUE: 361 Ohm
MDC IDC MSMT LEADCHNL RA IMPEDANCE VALUE: 532 Ohm
MDC IDC MSMT LEADCHNL RA PACING THRESHOLD AMPLITUDE: 1 V
MDC IDC MSMT LEADCHNL RA SENSING INTR AMPL: 2.5 mV
MDC IDC MSMT LEADCHNL RA SENSING INTR AMPL: 4.25 mV
MDC IDC MSMT LEADCHNL RV IMPEDANCE VALUE: 494 Ohm
MDC IDC MSMT LEADCHNL RV PACING THRESHOLD AMPLITUDE: 0.75 V
MDC IDC MSMT LEADCHNL RV PACING THRESHOLD PULSEWIDTH: 0.4 ms
MDC IDC MSMT LEADCHNL RV SENSING INTR AMPL: 5.75 mV
MDC IDC PG IMPLANT DT: 20171215
MDC IDC SET LEADCHNL RV PACING AMPLITUDE: 3.5 V
MDC IDC SET LEADCHNL RV SENSING SENSITIVITY: 2.8 mV
MDC IDC STAT BRADY AP VP PERCENT: 0.03 %
MDC IDC STAT BRADY AS VS PERCENT: 79.39 %
MDC IDC STAT BRADY RA PERCENT PACED: 20.58 %

## 2016-09-27 NOTE — Progress Notes (Signed)
Wound check appointment. Steri-strips removed. Wound without redness or edema. Incision edges approximated, wound well healed. Normal device function. Thresholds, sensing, and impedances consistent with implant measurements. Device programmed at 3.5V for extra safety margin until 3 month visit. Histogram distribution appropriate for patient and level of activity. No mode switches or high ventricular rates noted. Patient educated about wound care, arm mobility, lifting restrictions. ROV in 3 months with implanting physician. 

## 2016-12-11 ENCOUNTER — Encounter: Payer: Self-pay | Admitting: Internal Medicine

## 2016-12-18 ENCOUNTER — Ambulatory Visit (INDEPENDENT_AMBULATORY_CARE_PROVIDER_SITE_OTHER): Payer: Medicare Other | Admitting: Internal Medicine

## 2016-12-18 ENCOUNTER — Encounter: Payer: Self-pay | Admitting: Internal Medicine

## 2016-12-18 DIAGNOSIS — I442 Atrioventricular block, complete: Secondary | ICD-10-CM

## 2016-12-18 LAB — CUP PACEART INCLINIC DEVICE CHECK
Battery Voltage: 3.06 V
Brady Statistic AP VP Percent: 0.94 %
Brady Statistic AS VP Percent: 0.02 %
Brady Statistic RA Percent Paced: 27.23 %
Date Time Interrogation Session: 20180320145927
Implantable Lead Implant Date: 20171215
Implantable Lead Location: 753860
Implantable Lead Model: 5076
Implantable Lead Model: 5076
Implantable Pulse Generator Implant Date: 20171215
Lead Channel Impedance Value: 418 Ohm
Lead Channel Impedance Value: 475 Ohm
Lead Channel Pacing Threshold Amplitude: 1 V
Lead Channel Pacing Threshold Pulse Width: 0.4 ms
Lead Channel Pacing Threshold Pulse Width: 0.4 ms
Lead Channel Sensing Intrinsic Amplitude: 6.125 mV
Lead Channel Setting Pacing Amplitude: 2 V
Lead Channel Setting Pacing Pulse Width: 0.4 ms
Lead Channel Setting Sensing Sensitivity: 2.8 mV
MDC IDC LEAD IMPLANT DT: 20171215
MDC IDC LEAD LOCATION: 753859
MDC IDC MSMT BATTERY REMAINING LONGEVITY: 135 mo
MDC IDC MSMT LEADCHNL RA IMPEDANCE VALUE: 361 Ohm
MDC IDC MSMT LEADCHNL RA IMPEDANCE VALUE: 532 Ohm
MDC IDC MSMT LEADCHNL RA SENSING INTR AMPL: 2.5 mV
MDC IDC MSMT LEADCHNL RA SENSING INTR AMPL: 5.25 mV
MDC IDC MSMT LEADCHNL RV PACING THRESHOLD AMPLITUDE: 0.625 V
MDC IDC MSMT LEADCHNL RV SENSING INTR AMPL: 5.625 mV
MDC IDC SET LEADCHNL RV PACING AMPLITUDE: 2.5 V
MDC IDC STAT BRADY AP VS PERCENT: 26.29 %
MDC IDC STAT BRADY AS VS PERCENT: 72.75 %
MDC IDC STAT BRADY RV PERCENT PACED: 0.97 %

## 2016-12-18 NOTE — Progress Notes (Signed)
HPI [name] returns today for followup. He is a pleasant 76 yo man with LBBB and CHB, due to [employer]	[place] attacks who underwent PPM insertion 3 months ago. In the interim, he has done well. No more syncope. He denies chest pain or sob.  No Known Allergies   Current Outpatient Prescriptions  Medication Sig Dispense Refill  . amLODipine (NORVASC) 5 MG tablet Take 2.5 mg by mouth daily.    . diphenhydramine-acetaminophen (TYLENOL PM) 25-500 MG TABS tablet Take 1 tablet by mouth at bedtime.    . docusate sodium (COLACE) 100 MG capsule Take 100 mg by mouth daily.    Marland Kitchen. lisinopril-hydrochlorothiazide (PRINZIDE,ZESTORETIC) 20-25 MG tablet Take 1 tablet by mouth daily.    Marland Kitchen. loratadine (CLARITIN) 10 MG tablet Take 10 mg by mouth daily as needed for allergies or itching.     . Multiple Vitamin (MULTIVITAMIN WITH MINERALS) TABS tablet Take 1 tablet by mouth daily.    . simvastatin (ZOCOR) 40 MG tablet Take 40 mg by mouth daily.  4  . triamcinolone cream (KENALOG) 0.1 % Apply 1 application topically daily as needed (itching/ rash).      No current facility-administered medications for this visit.      Past Medical History:  Diagnosis Date  . High cholesterol   . History of left bundle branch block (LBBB) 05/01/2015  . Hypertension   . Pacemaker    a. 09/14/16: CHB s/p PPM: Medtronic (serial number [id number]) pacemaker  . 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   Past Surgical History:  Procedure Laterality Date  . BACK SURGERY    . EP IMPLANTABLE DEVICE N/A 09/14/2016   Procedure: Pacemaker Implant;  Surgeon: Marinus MawGregg W Taylor, MD;  [facility];  Service: Cardiovascular;  Laterality: N/A;  . NOSE SURGERY       Family History  Problem Relation Age of Onset  . Heart failure Mother      Social History   Social History  . Marital status: Married   Spouse name: N/A  . Number of children: N/A  . Years of education: N/A   Occupational History  . Not on file.   Social History Main Topics  . Smoking status: Never Smoker  . Smokeless tobacco: Never Used  . Alcohol use No  . Drug use: No  . Sexual activity: Not on file   Other Topics Concern  . Not on file   Social History Narrative  . No narrative on file     BP (!) 152/82   Pulse 67   Ht 5\' 10"  (1.778 m)   Wt 218 lb 12.8 oz (99.2 kg)   SpO2 94%   BMI 31.39 kg/m   Physical Exam:  Well appearing NAD HEENT: Unremarkable Neck:  No JVD, no thyromegally Lymphatics:  No adenopathy Back:  No CVA tenderness Lungs:  Clear HEART:  Regular rate rhythm, no murmurs, no rubs, no clicks Abd:  soft, positive bowel sounds, no organomegally, no rebound, no guarding Ext:  2 plus pulses, no edema, no cyanosis, no clubbing Skin:  No rashes no nodules Neuro:  CN II through XII intact, motor grossly intact  EKG - NSR with LBBB  DEVICE  Normal device function.  See PaceArt for details.   Assess/Plan: 1. [place] syncope - he has had no additional spells. Will follow  2. HTN - his blood pressure is elevated but he notes that at home it is well controlled. He is encouraged to maintain a low sodium diet. 3. PPM - his medtronic DDD PM is working normally. Will follow.  Leonia Reeves.D.

## 2016-12-18 NOTE — Patient Instructions (Addendum)
Medication Instructions:  Your physician recommends that you continue on your current medications as directed. Please refer to the Current Medication list given to you today.   Labwork: None ordered  Testing/Procedures: None ordered  Follow-Up: Remote monitoring is used to monitor your Pacemaker from home. This monitoring reduces the number of office visits required to check your device to one time per year. It allows us to keep an eye on the functioning of your device to ensure it is working properly. You are scheduled for a device check from home on 03/19/17. You may send your transmission at any time that day. If you have a wireless device, the transmission will be sent automatically. After your physician reviews your transmission, you will receive a postcard with your next transmission date.  Your physician wants you to follow-up in: 9 months with Dr. Taylor. You will receive a reminder letter in the mail two months in advance. If you don't receive a letter, please call our office to schedule the follow-up appointment.   Any Other Special Instructions Will Be Listed Below (If Applicable).     If you need a refill on your cardiac medications before your next appointment, please call your pharmacy.  

## 2017-03-19 ENCOUNTER — Ambulatory Visit (INDEPENDENT_AMBULATORY_CARE_PROVIDER_SITE_OTHER): Payer: Medicare Other | Admitting: *Deleted

## 2017-03-19 DIAGNOSIS — I442 Atrioventricular block, complete: Secondary | ICD-10-CM | POA: Diagnosis not present

## 2017-03-19 NOTE — Progress Notes (Signed)
Remote pacemaker transmission.   

## 2017-03-20 LAB — CUP PACEART REMOTE DEVICE CHECK
Brady Statistic AP VP Percent: 3.4 %
Brady Statistic AP VS Percent: 24.6 %
Brady Statistic AS VP Percent: 0.02 %
Brady Statistic AS VS Percent: 71.98 %
Brady Statistic RV Percent Paced: 3.49 %
Date Time Interrogation Session: 20180619163827
Implantable Lead Implant Date: 20171215
Implantable Lead Location: 753859
Implantable Lead Model: 5076
Lead Channel Impedance Value: 380 Ohm
Lead Channel Impedance Value: 475 Ohm
Lead Channel Impedance Value: 513 Ohm
Lead Channel Pacing Threshold Amplitude: 0.625 V
Lead Channel Sensing Intrinsic Amplitude: 6.625 mV
Lead Channel Sensing Intrinsic Amplitude: 6.625 mV
Lead Channel Setting Pacing Amplitude: 2 V
Lead Channel Setting Pacing Amplitude: 2.5 V
Lead Channel Setting Pacing Pulse Width: 0.4 ms
Lead Channel Setting Sensing Sensitivity: 2.8 mV
MDC IDC LEAD IMPLANT DT: 20171215
MDC IDC LEAD LOCATION: 753860
MDC IDC MSMT BATTERY REMAINING LONGEVITY: 127 mo
MDC IDC MSMT BATTERY VOLTAGE: 3.04 V
MDC IDC MSMT LEADCHNL RA PACING THRESHOLD AMPLITUDE: 0.75 V
MDC IDC MSMT LEADCHNL RA PACING THRESHOLD PULSEWIDTH: 0.4 ms
MDC IDC MSMT LEADCHNL RA SENSING INTR AMPL: 2.375 mV
MDC IDC MSMT LEADCHNL RA SENSING INTR AMPL: 2.375 mV
MDC IDC MSMT LEADCHNL RV IMPEDANCE VALUE: 399 Ohm
MDC IDC MSMT LEADCHNL RV PACING THRESHOLD PULSEWIDTH: 0.4 ms
MDC IDC PG IMPLANT DT: 20171215
MDC IDC STAT BRADY RA PERCENT PACED: 28 %

## 2017-03-22 ENCOUNTER — Encounter: Payer: Self-pay | Admitting: Cardiology

## 2017-06-18 ENCOUNTER — Ambulatory Visit (INDEPENDENT_AMBULATORY_CARE_PROVIDER_SITE_OTHER): Payer: Medicare Other | Admitting: *Deleted

## 2017-06-18 DIAGNOSIS — I442 Atrioventricular block, complete: Secondary | ICD-10-CM

## 2017-06-18 NOTE — Progress Notes (Signed)
Remote pacemaker transmission.   

## 2017-06-19 LAB — CUP PACEART REMOTE DEVICE CHECK
Battery Voltage: 3.03 V
Brady Statistic AP VP Percent: 4.47 %
Brady Statistic AS VS Percent: 70.17 %
Brady Statistic RA Percent Paced: 29.82 %
Date Time Interrogation Session: 20180918151536
Implantable Lead Implant Date: 20171215
Implantable Lead Implant Date: 20171215
Implantable Lead Location: 753860
Implantable Lead Model: 5076
Implantable Pulse Generator Implant Date: 20171215
Lead Channel Impedance Value: 361 Ohm
Lead Channel Impedance Value: 399 Ohm
Lead Channel Impedance Value: 513 Ohm
Lead Channel Pacing Threshold Amplitude: 0.625 V
Lead Channel Pacing Threshold Amplitude: 0.625 V
Lead Channel Pacing Threshold Pulse Width: 0.4 ms
Lead Channel Pacing Threshold Pulse Width: 0.4 ms
Lead Channel Sensing Intrinsic Amplitude: 2.625 mV
Lead Channel Sensing Intrinsic Amplitude: 2.625 mV
MDC IDC LEAD LOCATION: 753859
MDC IDC MSMT BATTERY REMAINING LONGEVITY: 120 mo
MDC IDC MSMT LEADCHNL RV IMPEDANCE VALUE: 456 Ohm
MDC IDC MSMT LEADCHNL RV SENSING INTR AMPL: 6.25 mV
MDC IDC MSMT LEADCHNL RV SENSING INTR AMPL: 6.25 mV
MDC IDC SET LEADCHNL RA PACING AMPLITUDE: 2 V
MDC IDC SET LEADCHNL RV PACING AMPLITUDE: 2.5 V
MDC IDC SET LEADCHNL RV PACING PULSEWIDTH: 0.4 ms
MDC IDC SET LEADCHNL RV SENSING SENSITIVITY: 2.8 mV
MDC IDC STAT BRADY AP VS PERCENT: 25.35 %
MDC IDC STAT BRADY AS VP PERCENT: 0.01 %
MDC IDC STAT BRADY RV PERCENT PACED: 4.6 %

## 2017-06-20 ENCOUNTER — Encounter: Payer: Self-pay | Admitting: Cardiology

## 2017-09-13 ENCOUNTER — Encounter: Payer: Medicare Other | Admitting: Internal Medicine

## 2017-09-17 ENCOUNTER — Telehealth: Payer: Self-pay | Admitting: Cardiology

## 2017-09-17 ENCOUNTER — Ambulatory Visit (INDEPENDENT_AMBULATORY_CARE_PROVIDER_SITE_OTHER): Payer: Medicare Other | Admitting: *Deleted

## 2017-09-17 ENCOUNTER — Encounter: Payer: Medicare Other | Admitting: Internal Medicine

## 2017-09-17 DIAGNOSIS — I442 Atrioventricular block, complete: Secondary | ICD-10-CM

## 2017-09-17 NOTE — Telephone Encounter (Signed)
Spoke with pt and reminded pt of remote transmission that is due today. Pt verbalized understanding.   

## 2017-09-18 ENCOUNTER — Encounter: Payer: Self-pay | Admitting: Cardiology

## 2017-09-18 NOTE — Progress Notes (Signed)
Remote pacemaker transmission.   

## 2017-09-19 LAB — CUP PACEART REMOTE DEVICE CHECK
Battery Remaining Longevity: 113 mo
Brady Statistic RA Percent Paced: 26.08 %
Date Time Interrogation Session: 20181218213935
Implantable Lead Implant Date: 20171215
Implantable Lead Location: 753860
Implantable Lead Model: 5076
Lead Channel Impedance Value: 418 Ohm
Lead Channel Pacing Threshold Amplitude: 0.625 V
Lead Channel Pacing Threshold Pulse Width: 0.4 ms
Lead Channel Pacing Threshold Pulse Width: 0.4 ms
Lead Channel Sensing Intrinsic Amplitude: 2.875 mV
Lead Channel Sensing Intrinsic Amplitude: 2.875 mV
MDC IDC LEAD IMPLANT DT: 20171215
MDC IDC LEAD LOCATION: 753859
MDC IDC MSMT BATTERY VOLTAGE: 3.02 V
MDC IDC MSMT LEADCHNL RA IMPEDANCE VALUE: 361 Ohm
MDC IDC MSMT LEADCHNL RA IMPEDANCE VALUE: 494 Ohm
MDC IDC MSMT LEADCHNL RV IMPEDANCE VALUE: 475 Ohm
MDC IDC MSMT LEADCHNL RV PACING THRESHOLD AMPLITUDE: 0.5 V
MDC IDC MSMT LEADCHNL RV SENSING INTR AMPL: 6 mV
MDC IDC MSMT LEADCHNL RV SENSING INTR AMPL: 6 mV
MDC IDC PG IMPLANT DT: 20171215
MDC IDC SET LEADCHNL RA PACING AMPLITUDE: 2 V
MDC IDC SET LEADCHNL RV PACING AMPLITUDE: 2.5 V
MDC IDC SET LEADCHNL RV PACING PULSEWIDTH: 0.4 ms
MDC IDC SET LEADCHNL RV SENSING SENSITIVITY: 2.8 mV
MDC IDC STAT BRADY AP VP PERCENT: 0.28 %
MDC IDC STAT BRADY AP VS PERCENT: 25.81 %
MDC IDC STAT BRADY AS VP PERCENT: 0.02 %
MDC IDC STAT BRADY AS VS PERCENT: 73.89 %
MDC IDC STAT BRADY RV PERCENT PACED: 0.3 %

## 2017-09-20 ENCOUNTER — Encounter: Payer: Medicare Other | Admitting: Internal Medicine

## 2017-10-11 ENCOUNTER — Encounter: Payer: Self-pay | Admitting: *Deleted

## 2017-10-30 ENCOUNTER — Encounter: Payer: Self-pay | Admitting: Internal Medicine

## 2017-10-30 ENCOUNTER — Ambulatory Visit: Payer: Medicare Other | Admitting: Internal Medicine

## 2017-10-30 VITALS — BP 116/72 | HR 70 | Ht 70.0 in | Wt 214.6 lb

## 2017-10-30 DIAGNOSIS — Z950 Presence of cardiac pacemaker: Secondary | ICD-10-CM

## 2017-10-30 DIAGNOSIS — I442 Atrioventricular block, complete: Secondary | ICD-10-CM | POA: Diagnosis not present

## 2017-10-30 NOTE — Patient Instructions (Signed)

## 2017-10-30 NOTE — Progress Notes (Signed)
HPI Mr. Quintero returns today for ongoing evaluation and management of CHB, s/p PPM insertion. He has done well in the interim and denies any additional syncope. He is now over a year out from his PPM insertion. He has returned to his usual daily acitivity. No sob or anginal symptoms.  No Known Allergies   Current Outpatient Medications  Medication Sig Dispense Refill  . amLODipine (NORVASC) 5 MG tablet Take 2.5 mg by mouth daily.    . diphenhydramine-acetaminophen (TYLENOL PM) 25-500 MG TABS tablet Take 1 tablet by mouth at bedtime.    . docusate sodium (COLACE) 100 MG capsule Take 100 mg by mouth daily.    Marland Kitchen lisinopril-hydrochlorothiazide (PRINZIDE,ZESTORETIC) 20-25 MG tablet Take 1 tablet by mouth daily.    Marland Kitchen loratadine (CLARITIN) 10 MG tablet Take 10 mg by mouth daily as needed for allergies or itching.     . Multiple Vitamin (MULTIVITAMIN WITH MINERALS) TABS tablet Take 1 tablet by mouth daily.    . simvastatin (ZOCOR) 40 MG tablet Take 40 mg by mouth daily.  4  . triamcinolone cream (KENALOG) 0.1 % Apply 1 application topically daily as needed (itching/ rash).      No current facility-administered medications for this visit.      Past Medical History:  Diagnosis Date  . High cholesterol   . History of left bundle branch block (LBBB) 05/01/2015  . Hypertension   . Pacemaker    a. 09/14/16: CHB s/p PPM: Medtronic (serial number [id number]) pacemaker  . 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   Past Surgical History:  Procedure Laterality Date  . BACK SURGERY    . EP IMPLANTABLE DEVICE N/A 09/14/2016   Procedure: Pacemaker Implant;  Surgeon: Marinus Maw, MD;  Location[facility] INVASIVE CV LAB;  Service: Cardiovascular;  Laterality: N/A;  . NOSE SURGERY       Family History  Problem Relation Age of Onset  . Heart failure Mother      Social History    Socioeconomic History  . Marital status: Married    Spouse name: Not on file  . Number of children: Not on file  . Years of education: Not on file  . Highest education level: Not on file  Social Needs  . Financial resource strain: Not on file  . Food insecurity - worry: Not on file  . Food insecurity - inability: Not on file  . Transportation needs - medical: Not on file  . Transportation needs - non-medical: Not on file  Occupational History  . Not on file  Tobacco Use  . Smoking status: Never Smoker  . Smokeless tobacco: Never Used  Substance and Sexual Activity  . Alcohol use: No  . Drug use: No  . Sexual activity: Not on file  Other Topics Concern  . Not on file  Social History Narrative  . Not on file     BP 116/72   Pulse 70   Ht 5\' 10"  (1.778 m)   Wt 214 lb 9.6 oz (97.3 kg)   SpO2 98%   BMI 30.79 kg/m   Physical Exam:  Well appearing 77 yo man, NAD HEENT: Unremarkable Neck:  6 cm JVD, no thyromegally Lymphatics:  No adenopathy Back:  No CVA tenderness Lungs:  Clear with no wheezes HEART:  Regular rate rhythm, no murmurs, no rubs, no clicks  Abd:  soft, positive bowel sounds, no organomegally, no rebound, no guarding Ext:  2 plus pulses, no edema, no cyanosis, no clubbing Skin:  No rashes no nodules Neuro:  CN II through XII intact, motor grossly intact  EKG - none  DEVICE  Normal device function.  See PaceArt for details.   Assess/Plan: 1. CHB - he is asymptomatic, s/p PPM insertion. No more syncope. His conduction has returned. 2. HTN - his blood pressure is well controlled.  3. PPM - his medtronic DDD PM is working normally. Will recheck in several months.   Leonia ReevesGregg Freemon Binford,M.D.

## 2017-10-31 LAB — CUP PACEART INCLINIC DEVICE CHECK
Battery Remaining Longevity: 113 mo
Brady Statistic AP VS Percent: 24.97 %
Brady Statistic AS VS Percent: 72.26 %
Brady Statistic RV Percent Paced: 2.84 %
Date Time Interrogation Session: 20190130161551
Implantable Lead Implant Date: 20171215
Implantable Lead Location: 753859
Implantable Lead Location: 753860
Lead Channel Impedance Value: 361 Ohm
Lead Channel Impedance Value: 513 Ohm
Lead Channel Pacing Threshold Amplitude: 0.75 V
Lead Channel Pacing Threshold Pulse Width: 0.4 ms
Lead Channel Pacing Threshold Pulse Width: 0.4 ms
Lead Channel Sensing Intrinsic Amplitude: 7.25 mV
Lead Channel Setting Pacing Amplitude: 2.5 V
Lead Channel Setting Pacing Pulse Width: 0.4 ms
Lead Channel Setting Sensing Sensitivity: 2.8 mV
MDC IDC LEAD IMPLANT DT: 20171215
MDC IDC MSMT BATTERY VOLTAGE: 3.02 V
MDC IDC MSMT LEADCHNL RA PACING THRESHOLD AMPLITUDE: 0.75 V
MDC IDC MSMT LEADCHNL RA SENSING INTR AMPL: 4.5 mV
MDC IDC MSMT LEADCHNL RV IMPEDANCE VALUE: 418 Ohm
MDC IDC MSMT LEADCHNL RV IMPEDANCE VALUE: 475 Ohm
MDC IDC PG IMPLANT DT: 20171215
MDC IDC SET LEADCHNL RA PACING AMPLITUDE: 2 V
MDC IDC STAT BRADY AP VP PERCENT: 2.76 %
MDC IDC STAT BRADY AS VP PERCENT: 0.02 %
MDC IDC STAT BRADY RA PERCENT PACED: 27.72 %

## 2017-12-17 ENCOUNTER — Ambulatory Visit (INDEPENDENT_AMBULATORY_CARE_PROVIDER_SITE_OTHER): Payer: Medicare Other | Admitting: *Deleted

## 2017-12-17 DIAGNOSIS — I442 Atrioventricular block, complete: Secondary | ICD-10-CM

## 2017-12-17 NOTE — Progress Notes (Signed)
Remote pacemaker transmission.   

## 2017-12-18 ENCOUNTER — Encounter: Payer: Self-pay | Admitting: Cardiology

## 2018-01-03 LAB — CUP PACEART REMOTE DEVICE CHECK
Battery Remaining Longevity: 113 mo
Battery Voltage: 3.02 V
Brady Statistic AP VS Percent: 22.13 %
Brady Statistic AS VP Percent: 0.87 %
Brady Statistic AS VS Percent: 76.81 %
Brady Statistic RV Percent Paced: 1.05 %
Date Time Interrogation Session: 20190319122900
Implantable Lead Implant Date: 20171215
Implantable Lead Location: 753859
Implantable Lead Location: 753860
Implantable Lead Model: 5076
Lead Channel Pacing Threshold Amplitude: 0.625 V
Lead Channel Sensing Intrinsic Amplitude: 2.375 mV
Lead Channel Sensing Intrinsic Amplitude: 2.375 mV
Lead Channel Sensing Intrinsic Amplitude: 7 mV
Lead Channel Setting Pacing Amplitude: 2.5 V
MDC IDC LEAD IMPLANT DT: 20171215
MDC IDC MSMT LEADCHNL RA IMPEDANCE VALUE: 361 Ohm
MDC IDC MSMT LEADCHNL RA IMPEDANCE VALUE: 513 Ohm
MDC IDC MSMT LEADCHNL RA PACING THRESHOLD PULSEWIDTH: 0.4 ms
MDC IDC MSMT LEADCHNL RV IMPEDANCE VALUE: 399 Ohm
MDC IDC MSMT LEADCHNL RV IMPEDANCE VALUE: 475 Ohm
MDC IDC MSMT LEADCHNL RV PACING THRESHOLD AMPLITUDE: 0.625 V
MDC IDC MSMT LEADCHNL RV PACING THRESHOLD PULSEWIDTH: 0.4 ms
MDC IDC MSMT LEADCHNL RV SENSING INTR AMPL: 7 mV
MDC IDC PG IMPLANT DT: 20171215
MDC IDC SET LEADCHNL RA PACING AMPLITUDE: 2 V
MDC IDC SET LEADCHNL RV PACING PULSEWIDTH: 0.4 ms
MDC IDC SET LEADCHNL RV SENSING SENSITIVITY: 2.8 mV
MDC IDC STAT BRADY AP VP PERCENT: 0.19 %
MDC IDC STAT BRADY RA PERCENT PACED: 22.31 %

## 2018-01-16 ENCOUNTER — Telehealth: Payer: Self-pay | Admitting: Neurology

## 2018-01-16 ENCOUNTER — Encounter: Payer: Self-pay | Admitting: Neurology

## 2018-01-16 ENCOUNTER — Encounter: Payer: Self-pay | Admitting: *Deleted

## 2018-01-16 ENCOUNTER — Ambulatory Visit: Payer: Medicare Other | Admitting: Neurology

## 2018-01-16 VITALS — BP 115/66 | HR 65 | Ht 70.0 in | Wt 209.0 lb

## 2018-01-16 DIAGNOSIS — H5462 Unqualified visual loss, left eye, normal vision right eye: Secondary | ICD-10-CM | POA: Diagnosis not present

## 2018-01-16 DIAGNOSIS — I771 Stricture of artery: Secondary | ICD-10-CM | POA: Diagnosis not present

## 2018-01-16 DIAGNOSIS — H469 Unspecified optic neuritis: Secondary | ICD-10-CM

## 2018-01-16 DIAGNOSIS — H4720 Unspecified optic atrophy: Secondary | ICD-10-CM | POA: Diagnosis not present

## 2018-01-16 NOTE — Patient Instructions (Signed)
Labwork today CT of the blood vessels of the head and neck Lumbar Puncture  Lumbar Puncture A lumbar puncture, or spinal tap, is a procedure in which a small amount of the fluid that surrounds the brain and spinal cord is removed and examined. The fluid is called the cerebrospinal fluid. This procedure may be done to:  Help diagnose various problems, such as meningitis, encephalitis, multiple sclerosis, and AIDS.  Remove fluid and relieve pressure that occurs with certain types of headaches.  Look for bleeding within the brain and spinal cord areas (central nervous system).  Place medicine into the spinal fluid.  Tell a health care provider about:  Any allergies you have.  All medicines you are taking, including vitamins, herbs, eye drops, creams, and over-the-counter medicines.  Any problems you or family members have had with anesthetic medicines.  Any blood disorders you have.  Any surgeries you have had.  Any medical conditions you have. What are the risks? Generally, this is a safe procedure. However, as with any procedure, complications can occur. Possible complications include:  Spinal headache. This is a severe headache that occurs when there is a leak of spinal fluid. A spinal headache causes discomfort but is not dangerous. If it persists, another procedure may be done to treat the headache.  Bleeding. This most often occurs in people with bleeding disorders. These are disorders in which the blood does not clot normally.  Infection at the insertion site that can spread to the bone or spinal fluid.  Formation of a spinal cord tumor (rare).  Brain herniation or movement of the brain into the spinal cord (rare).  Inability to move (extremely rare).  What happens before the procedure?  You may have blood tests done. These tests can help tell how well your kidneys and liver are working. They can also show how well your blood clots.  If you take blood thinners  (anticoagulant medicine), ask your health care provider if and when you should stop taking them.  Your health care provider may order a CT scan of your brain.  Make arrangements for someone to drive you home after the procedure. What happens during the procedure?  You will be positioned so that the spaces between the bones of the spine (vertebrae) are as wide as possible. This will make it easier to pass the needle into the spinal canal.  Depending on your age and size, you may lie on your side, curled up with your knees under your chin. Or, you may sit with your head resting on a pillow that is placed at waist level.  The skin covering the lower back (or lumbar region) will be cleaned.  The skin may be numbed with medicine.  You may be given pain medicine or a medicine to help you relax (sedative).  A small needle will be inserted in the skin until it enters the space that contains the spinal fluid. The needle will not enter the spinal cord.  The spinal fluid will be collected into tubes.  The needle will be withdrawn, and a bandage will be placed on the site. What happens after the procedure?  You will remain lying down for 1 hour or for as long as your health care provider suggests.  The spinal fluid will be sent to a laboratory to be examined. The results of the examination may be available before you go home.  A test, called a culture, may be taken of the spinal fluid if your health care provider  thinks you have an infection. If cultures were taken for exam, the results will usually be available in a couple of days. This information is not intended to replace advice given to you by your health care provider. Make sure you discuss any questions you have with your health care provider. Document Released: 09/14/2000 Document Revised: 02/23/2016 Document Reviewed: 05/25/2013 Elsevier Interactive Patient Education  2017 [employer].

## 2018-01-16 NOTE — Telephone Encounter (Signed)
[facility],. No auth they will reach out to the pt to schedule.
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
## 2018-01-17 ENCOUNTER — Telehealth: Payer: Self-pay | Admitting: Neurology

## 2018-01-17 NOTE — Telephone Encounter (Signed)
Dr Lucia GaskinsAhern had asked me to reach out to the pt cause she noticed in his lab work that it was glucose was elevated at 364. I have called the patient to make him aware of this lab. The patient states that he has just had a heavy breakfast prior to coming into the office. He informed me that he is aware of diabetes and the severity of that being an elevated number since his wife and son struggles with having to monitor the glucose levels. He states that he will use his wife's machine and check his sugar and see what it is today. I have encouraged him to follow up with his PCP to have his lab work re checked. The pt states that he has a apt coming up in June on the 1st. I have encouraged that he may should see if the PCP would like to see him sooner or at least recheck his lab work sooner. I went over symptoms of DKA which the patient informed me his aware of but he was very appreciative for the call and the concern and promises to have it re-evaluated.

## 2018-01-20 ENCOUNTER — Telehealth: Payer: Self-pay | Admitting: Neurology

## 2018-01-20 NOTE — Telephone Encounter (Signed)
Toniann FailWendy from [employer] office has called(with pt there) asking that pt's recent labs be faxed over just as soon as possible.  Please fax to [phone] ph is [phone]

## 2018-01-20 NOTE — Telephone Encounter (Signed)
Labs from 01/16/18 faxed to Dr. Thomasene LotGreen's office (PCP) per request. Received a receipt of confirmation.

## 2018-01-21 LAB — SJOGREN'S SYNDROME ANTIBODS(SSA + SSB)

## 2018-01-21 LAB — COMPREHENSIVE METABOLIC PANEL
A/G RATIO: 1.8 (ref 1.2–2.2)
ALK PHOS: 104 IU/L (ref 39–117)
ALT: 29 IU/L (ref 0–44)
AST: 24 IU/L (ref 0–40)
Albumin: 4.2 g/dL (ref 3.5–4.8)
BILIRUBIN TOTAL: 0.6 mg/dL (ref 0.0–1.2)
BUN/Creatinine Ratio: 14 (ref 10–24)
BUN: 13 mg/dL (ref 8–27)
CHLORIDE: 96 mmol/L (ref 96–106)
CO2: 26 mmol/L (ref 20–29)
Calcium: 9.6 mg/dL (ref 8.6–10.2)
Creatinine, Ser: 0.91 mg/dL (ref 0.76–1.27)
GFR calc Af Amer: 95 mL/min/{1.73_m2} (ref >59)
GFR, EST NON AFRICAN AMERICAN: 82 mL/min/{1.73_m2} (ref >59)
GLOBULIN, TOTAL: 2.3 g/dL (ref 1.5–4.5)
Glucose: 364 mg/dL — ABNORMAL HIGH (ref 65–99)
POTASSIUM: 4.5 mmol/L (ref 3.5–5.2)
Sodium: 134 mmol/L (ref 134–144)
Total Protein: 6.5 g/dL (ref 6.0–8.5)

## 2018-01-21 LAB — MULTIPLE MYELOMA PANEL, SERUM
ALBUMIN/GLOB SERPL: 1.5 (ref 0.7–1.7)
ALPHA 1: 0.2 g/dL (ref 0.0–0.4)
ALPHA2 GLOB SERPL ELPH-MCNC: 0.7 g/dL (ref 0.4–1.0)
Albumin SerPl Elph-Mcnc: 3.8 g/dL (ref 2.9–4.4)
B-GLOBULIN SERPL ELPH-MCNC: 1.1 g/dL (ref 0.7–1.3)
Gamma Glob SerPl Elph-Mcnc: 0.7 g/dL (ref 0.4–1.8)
Globulin, Total: 2.7 g/dL (ref 2.2–3.9)
IGM (IMMUNOGLOBULIN M), SRM: 34 mg/dL (ref 15–143)
IgA/Immunoglobulin A, Serum: 153 mg/dL (ref 61–437)
IgG (Immunoglobin G), Serum: 734 mg/dL (ref 700–1600)

## 2018-01-21 LAB — B. BURGDORFI ANTIBODIES

## 2018-01-21 LAB — SEDIMENTATION RATE: Sed Rate: 2 mm/hr (ref 0–30)

## 2018-01-21 LAB — BARTONELLA ANITBODY PANEL
B HENSELAE IGG: NEGATIVE {titer}
B HENSELAE IGM: NEGATIVE {titer}
B QUINTANA IGG: NEGATIVE {titer}
B QUINTANA IGM: NEGATIVE {titer}

## 2018-01-21 LAB — B12 AND FOLATE PANEL
Folate: 19.7 ng/mL (ref >3.0)
Vitamin B-12: 1029 pg/mL (ref 232–1245)

## 2018-01-21 LAB — CBC
HEMATOCRIT: 46.1 % (ref 37.5–51.0)
Hemoglobin: 15.4 g/dL (ref 13.0–17.7)
MCH: 31.3 pg (ref 26.6–33.0)
MCHC: 33.4 g/dL (ref 31.5–35.7)
MCV: 94 fL (ref 79–97)
PLATELETS: 167 10*3/uL (ref 150–379)
RBC: 4.92 x10E6/uL (ref 4.14–5.80)
RDW: 13.4 % (ref 12.3–15.4)
WBC: 4.2 10*3/uL (ref 3.4–10.8)

## 2018-01-21 LAB — TOXOPLASMA ANTIBODIES- IGG AND  IGM: Toxoplasma IgG Ratio: 16.4 IU/mL — ABNORMAL HIGH (ref 0.0–7.1)

## 2018-01-21 LAB — PAN-ANCA
ANCA Proteinase 3: <3.5 U/mL (ref 0.0–3.5)
Atypical pANCA: <1:20 {titer}

## 2018-01-21 LAB — HEAVY METALS, BLOOD
Arsenic: 6 ug/L (ref 2–23)
LEAD, BLOOD: 2 ug/dL (ref 0–4)
MERCURY: NOT DETECTED ug/L (ref 0.0–14.9)

## 2018-01-21 LAB — VITAMIN B1: Thiamine: 153.9 nmol/L (ref 66.5–200.0)

## 2018-01-21 LAB — TSH: TSH: 1.65 u[IU]/mL (ref 0.450–4.500)

## 2018-01-21 LAB — METHYLMALONIC ACID, SERUM: METHYLMALONIC ACID: 146 nmol/L (ref 0–378)

## 2018-01-21 LAB — C-REACTIVE PROTEIN: CRP: 5.7 mg/L — ABNORMAL HIGH (ref 0.0–4.9)

## 2018-01-21 LAB — ANTINUCLEAR ANTIBODIES, IFA: ANA Titer 1: NEGATIVE

## 2018-01-21 LAB — ANGIOTENSIN CONVERTING ENZYME: Angio Convert Enzyme: <15 U/L (ref 14–82)

## 2018-01-21 LAB — NEUROMYELITIS OPTICA AUTOAB, IGG: NMO IgG Autoantibodies: <1.5 U/mL (ref 0.0–3.0)

## 2018-01-21 LAB — VITAMIN B6: VITAMIN B6: 23.6 ug/L (ref 5.3–46.7)

## 2018-01-21 LAB — RPR: RPR: NONREACTIVE

## 2018-01-21 NOTE — Telephone Encounter (Signed)
Called pt & LVM (ok per DPR) informing him that other than the elevated glucose that we had talked to him about last week, the other labs that have resulted are unremarkable. However there are few more pending and we will call him if those results are abnormal. Left office number in case pt had any questions and informed him that a return call is not required.    Notes recorded by Anson FretAhern, Antonia B, MD on 01/20/2018 at 9:54 PM EDT Other than the elevated glucose we called him last week about, labs are unremarkable. A few more are pending, we will call if any of those are abnormal thanks

## 2018-01-22 ENCOUNTER — Telehealth: Payer: Self-pay | Admitting: Neurology

## 2018-01-22 NOTE — Telephone Encounter (Signed)
Called Jennifer back and LVM asking for a call back to discuss her questions about LP labs for the patient. Left office number in message.

## 2018-01-22 NOTE — Telephone Encounter (Signed)
Victorino DikeJennifer with [facility] calling. Patient has LP scheduled on 01-31-18 and she has questions about labs.

## 2018-01-23 NOTE — Telephone Encounter (Signed)
Victorino DikeJennifer @ [facility] has called RN Toma CopierBethany back and is asking for a call back re: pt's labs.  Victorino DikeJennifer can be reached at [phone]

## 2018-01-23 NOTE — Telephone Encounter (Signed)
Spoke with Victorino DikeJennifer @ [facility]. She stated that the Bartonella test that Dr. Lucia GaskinsAhern ordered for CSF testing is a blood test only. RN informed Victorino DikeJennifer that Dr. Lucia GaskinsAhern is currently out of the office but will return Monday 4/29. Pt's LP is scheduled for Fri 5/3. Will inform Dr. Lucia GaskinsAhern upon her return.   Note: blood Bartonella Antibody Panel was done on 01/16/18.

## 2018-01-24 NOTE — Telephone Encounter (Signed)
Called Jennifer at [facility]. LVM informing that Dr. Lucia GaskinsAhern is aware and ok with the fact that Bartonella cannot be tested on the patient's CSF at his upcoming LP. Left office number in case she had any other questions.

## 2018-01-24 NOTE — Telephone Encounter (Signed)
Thanks that's fine.

## 2018-01-25 ENCOUNTER — Ambulatory Visit
Admission: RE | Admit: 2018-01-25 | Discharge: 2018-01-25 | Disposition: A | Discharge status: Home / Self Care | Payer: Medicare Other | Source: Ambulatory Visit | Attending: Neurology | Admitting: Neurology

## 2018-01-25 ENCOUNTER — Other Ambulatory Visit: Payer: Medicare Other

## 2018-01-25 DIAGNOSIS — I771 Stricture of artery: Secondary | ICD-10-CM

## 2018-01-25 DIAGNOSIS — H5462 Unqualified visual loss, left eye, normal vision right eye: Secondary | ICD-10-CM

## 2018-01-25 DIAGNOSIS — H469 Unspecified optic neuritis: Secondary | ICD-10-CM

## 2018-01-25 DIAGNOSIS — H4720 Unspecified optic atrophy: Secondary | ICD-10-CM

## 2018-01-25 MED ORDER — IOPAMIDOL (ISOVUE-370) INJECTION 76%
75.0000 mL | Freq: Once | INTRAVENOUS | Status: AC | PRN
  Administered 2018-01-25: 75 mL via INTRAVENOUS

## 2018-01-29 ENCOUNTER — Telehealth: Payer: Self-pay | Admitting: *Deleted

## 2018-01-29 NOTE — Telephone Encounter (Addendum)
Spoke with patient. Discussed that his CTA of head and neck are unremarkable. There are no findings to suggest optic atrophy related to arterial problems. He verbalized  understanding and was very appreciative of the call. He reported that his next step is an LP on Friday 5/3.   ----- Message from Anson Fret, MD sent at 01/28/2018 10:23 AM EDT ----- The CTA of the head and neck are unremarkable. There are no findings to suggest optic atrophy related to arterial problems.

## 2018-01-31 ENCOUNTER — Other Ambulatory Visit (HOSPITAL_COMMUNITY)
Admission: RE | Admit: 2018-01-31 | Discharge: 2018-01-31 | Disposition: A | Discharge status: Home / Self Care | Payer: Medicare Other | Source: Ambulatory Visit | Attending: Neurology | Admitting: Neurology

## 2018-01-31 ENCOUNTER — Ambulatory Visit
Admission: RE | Admit: 2018-01-31 | Discharge: 2018-01-31 | Disposition: A | Discharge status: Home / Self Care | Payer: Medicare Other | Source: Ambulatory Visit | Attending: Neurology | Admitting: Neurology

## 2018-01-31 DIAGNOSIS — H469 Unspecified optic neuritis: Secondary | ICD-10-CM | POA: Insufficient documentation

## 2018-01-31 DIAGNOSIS — H4720 Unspecified optic atrophy: Secondary | ICD-10-CM

## 2018-01-31 DIAGNOSIS — H5462 Unqualified visual loss, left eye, normal vision right eye: Secondary | ICD-10-CM | POA: Diagnosis present

## 2018-01-31 NOTE — Discharge Instructions (Signed)

## 2018-02-05 ENCOUNTER — Telehealth: Payer: Self-pay | Admitting: Neurology

## 2018-02-05 NOTE — Telephone Encounter (Signed)
Called the pt and informed him of the LP results. Pt verbalized understanding and asked if there was anything else needed. I informed him looked like everything was completed. Pt has a apt coming up in aug and Dr Lucia Gaskins will discuss more then.pt phone went out as we were talking.  If pt calls back just make him aware that I had nothing else to add and to keep upcoming apt in august.

## 2018-02-05 NOTE — Telephone Encounter (Signed)
-----   Message from Anson Fret, MD sent at 02/04/2018  4:50 PM EDT ----- His csf labs are unremarkable no reason seen for his optic neuropathy thanks

## 2018-02-09 LAB — HERPES SIMPLEX VIRUS(HSV) DNA BY PCR
HSV 1 DNA: NOT DETECTED
HSV 2 DNA: NOT DETECTED

## 2018-02-09 LAB — BORRELIA SPECIES DNA, FLUID, PCR: BBURGDNAFLU: NOT DETECTED

## 2018-02-09 LAB — CSF CELL COUNT WITH DIFFERENTIAL
RBC Count, CSF: 590 cells/uL — ABNORMAL HIGH (ref 0–10)
WBC CSF: 0 {cells}/uL (ref 0–5)

## 2018-02-09 LAB — VARICELLA-ZOSTER BY PCR: VZV DNA, QL PCR: NOT DETECTED

## 2018-02-09 LAB — CSF CULTURE
MICRO NUMBER: 90542722
SPECIMEN QUALITY: ADEQUATE

## 2018-02-09 LAB — GLUCOSE, CSF: GLUCOSE CSF: 82 mg/dL — AB (ref 40–80)

## 2018-02-09 LAB — CRYPTOCOCCAL AG, LTX SCR RFLX TITER
Cryptococcal Ag Screen: NOT DETECTED
MICRO NUMBER:: 90542721
SPECIMEN QUALITY:: ADEQUATE

## 2018-02-09 LAB — HERPES SIMPLEX VIRUS 1/2 (IGG), CSF

## 2018-02-09 LAB — PROTEIN, CSF: Total Protein, CSF: 70 mg/dL — ABNORMAL HIGH (ref 15–60)

## 2018-02-09 LAB — VDRL, CSF: VDRL Quant, CSF: NONREACTIVE

## 2018-02-09 LAB — MYCOPLASMA PNEUMONIAE BY PCR: Mycoplasma pneumo by PCR: NOT DETECTED

## 2018-02-09 LAB — LEUKEMIA/LYMPHOMA EVALUATION PANEL

## 2018-02-09 LAB — CSF CULTURE W GRAM STAIN: Result:: NO GROWTH

## 2018-02-09 LAB — TOXOPLASMA DNA, QUAL BY PCR: Toxoplasma, DNA QL PCR: NOT DETECTED

## 2018-03-18 ENCOUNTER — Ambulatory Visit (INDEPENDENT_AMBULATORY_CARE_PROVIDER_SITE_OTHER): Payer: Medicare Other | Admitting: *Deleted

## 2018-03-18 DIAGNOSIS — I442 Atrioventricular block, complete: Secondary | ICD-10-CM

## 2018-03-18 NOTE — Progress Notes (Signed)
Remote pacemaker transmission.   

## 2018-03-21 LAB — CUP PACEART REMOTE DEVICE CHECK
Brady Statistic AP VP Percent: 15.41 %
Brady Statistic AS VP Percent: 25.59 %
Brady Statistic RA Percent Paced: 31.89 %
Implantable Lead Implant Date: 20171215
Implantable Lead Location: 753859
Implantable Lead Location: 753860
Implantable Lead Model: 5076
Implantable Lead Model: 5076
Lead Channel Impedance Value: 342 Ohm
Lead Channel Impedance Value: 380 Ohm
Lead Channel Pacing Threshold Amplitude: 0.75 V
Lead Channel Pacing Threshold Pulse Width: 0.4 ms
Lead Channel Pacing Threshold Pulse Width: 0.4 ms
Lead Channel Sensing Intrinsic Amplitude: 5.625 mV
Lead Channel Setting Pacing Amplitude: 2 V
Lead Channel Setting Pacing Amplitude: 2.5 V
Lead Channel Setting Pacing Pulse Width: 0.4 ms
MDC IDC LEAD IMPLANT DT: 20171215
MDC IDC MSMT BATTERY REMAINING LONGEVITY: 92 mo
MDC IDC MSMT BATTERY VOLTAGE: 3.01 V
MDC IDC MSMT LEADCHNL RA IMPEDANCE VALUE: 475 Ohm
MDC IDC MSMT LEADCHNL RA PACING THRESHOLD AMPLITUDE: 0.75 V
MDC IDC MSMT LEADCHNL RA SENSING INTR AMPL: 2.125 mV
MDC IDC MSMT LEADCHNL RA SENSING INTR AMPL: 2.125 mV
MDC IDC MSMT LEADCHNL RV IMPEDANCE VALUE: 437 Ohm
MDC IDC MSMT LEADCHNL RV SENSING INTR AMPL: 5.625 mV
MDC IDC PG IMPLANT DT: 20171215
MDC IDC SESS DTM: 20190618161116
MDC IDC SET LEADCHNL RV SENSING SENSITIVITY: 2.8 mV
MDC IDC STAT BRADY AP VS PERCENT: 16.49 %
MDC IDC STAT BRADY AS VS PERCENT: 42.5 %
MDC IDC STAT BRADY RV PERCENT PACED: 40.92 %

## 2018-05-19 ENCOUNTER — Ambulatory Visit: Payer: Medicare Other | Admitting: Neurology

## 2018-05-19 ENCOUNTER — Encounter: Payer: Self-pay | Admitting: Neurology

## 2018-05-19 VITALS — BP 142/83 | HR 66 | Ht 70.0 in | Wt 209.0 lb

## 2018-05-19 DIAGNOSIS — R413 Other amnesia: Secondary | ICD-10-CM

## 2018-05-19 DIAGNOSIS — H4720 Unspecified optic atrophy: Secondary | ICD-10-CM | POA: Diagnosis not present

## 2018-05-19 DIAGNOSIS — R202 Paresthesia of skin: Secondary | ICD-10-CM | POA: Diagnosis not present
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
## 2018-05-19 NOTE — Patient Instructions (Addendum)
Schedule another appointment if want to discuss memory changes but recommend Discussing with Dr. Chilton SiGreen First and reading the things I gave you about normal cognitive aging   Diabetic Neuropathy Diabetic neuropathy is a nerve disease or nerve damage that is caused by diabetes mellitus. About half of all people with diabetes mellitus have some form of nerve damage. Nerve damage is more common in those who have had diabetes mellitus for many years and who generally have not had good control of their blood sugar (glucose) level. Diabetic neuropathy is a common complication of diabetes mellitus. There are three common types of diabetic neuropathy and a fourth type that is less common and less understood:  Peripheral neuropathy-This is the most common type of diabetic neuropathy. It causes damage to the nerves of the feet and legs first and then eventually the hands and arms. The damage affects the ability to sense touch.  Autonomic neuropathy-This type causes damage to the autonomic nervous system, which controls the following functions: ? Heartbeat. ? Body temperature. ? Blood pressure. ? Urination. ? Digestion. ? Sweating. ? Sexual function.  Focal neuropathy-Focal neuropathy can be painful and unpredictable and occurs most often in older adults with diabetes mellitus. It involves a specific nerve or one area and often comes on suddenly. It usually does not cause long-term problems.  Radiculoplexus neuropathy- Sometimes called lumbosacral radiculoplexus neuropathy, radiculoplexus neuropathy affects the nerves of the thighs, hips, buttocks, or legs. It is more common in people with type 2 diabetes mellitus and in older men. It is characterized by debilitating pain, weakness, and atrophy, usually in the thigh muscles.  What are the causes? The cause of peripheral, autonomic, and focal neuropathies is diabetes mellitus that is uncontrolled and high glucose levels. The cause of radiculoplexus  neuropathy is unknown. However, it is thought to be caused by inflammation related to uncontrolled glucose levels. What are the signs or symptoms? Peripheral Neuropathy Peripheral neuropathy develops slowly over time. When the nerves of the feet and legs no longer work there may be:  Burning, stabbing, or aching pain in the legs or feet.  Inability to feel pressure or pain in your feet. This can lead to: ? Thick calluses over pressure areas. ? Pressure sores. ? Ulcers.  Foot deformities.  Reduced ability to feel temperature changes.  Muscle weakness.  Autonomic Neuropathy The symptoms of autonomic neuropathy vary depending on which nerves are affected. Symptoms may include:  Problems with digestion, such as: ? Feeling sick to your stomach (nausea). ? Vomiting. ? Bloating. ? Constipation. ? Diarrhea. ? Abdominal pain.  Difficulty with urination. This occurs if you lose your ability to sense when your bladder is full. Problems include: ? Urine leakage (incontinence). ? Inability to empty your bladder completely (retention).  Rapid or irregular heartbeat (palpitations).  Blood pressure drops when you stand up (orthostatic hypotension). When you stand up you may feel: ? Dizzy. ? Weak. ? Faint.  In men, inability to attain and maintain an erection.  In women, vaginal dryness and problems with decreased sexual desire and arousal.  Problems with body temperature regulation.  Increased or decreased sweating.  Focal Neuropathy  Abnormal eye movements or abnormal alignment of both eyes.  Weakness in the wrist.  Foot drop. This results in an inability to lift the foot properly and abnormal walking or foot movement.  Paralysis on one side of your face (Bell palsy).  Chest or abdominal pain. Radiculoplexus Neuropathy  Sudden, severe pain in your hip, thigh, or buttocks.  Weakness and wasting of thigh muscles.  Difficulty rising from a seated position.  Abdominal  swelling.  Unexplained weight loss (usually more than 10 lb [4.5 kg]). How is this diagnosed? Peripheral Neuropathy Your senses may be tested. Sensory function testing can be done with:  A light touch using a monofilament.  A vibration with tuning fork.  A sharp sensation with a pin prick.  Other tests that can help diagnose neuropathy are:  Nerve conduction velocity. This test checks the transmission of an electrical current through a nerve.  Electromyography. This shows how muscles respond to electrical signals transmitted by nearby nerves.  Quantitative sensory testing. This is used to assess how your nerves respond to vibrations and changes in temperature.  Autonomic Neuropathy Diagnosis is often based on reported symptoms. Tell your health care provider if you experience:  Dizziness.  Constipation.  Diarrhea.  Inappropriate urination or inability to urinate.  Inability to get or maintain an erection.  Tests that may be done include:  Electrocardiography or Holter monitor. These are tests that can help show problems with the heart rate or heart rhythm.  An X-ray exam may be done.  Focal Neuropathy Diagnosis is made based on your symptoms and what your health care provider finds during your exam. Other tests may be done. They may include:  Nerve conduction velocities. This checks the transmission of electrical current through a nerve.  Electromyography. This shows how muscles respond to electrical signals transmitted by nearby nerves.  Quantitative sensory testing. This test is used to assess how your nerves respond to vibration and changes in temperature.  Radiculoplexus Neuropathy  Often the first thing is to eliminate any other issue or problems that might be the cause, as there is no standard test for diagnosis.  X-ray exam of your spine and lumbar region.  Spinal tap to rule out cancer.  MRI to rule out other lesions. How is this treated? Once nerve  damage occurs, it cannot be reversed. The goal of treatment is to keep the disease or nerve damage from getting worse and affecting more nerve fibers. Controlling your blood glucose level is the key. Most people with radiculoplexus neuropathy see at least a partial improvement over time. You will need to keep your blood glucose and HbA1c levels in the target range determined by your health care provider. Things that help control blood glucose levels include:  Blood glucose monitoring.  Meal planning.  Physical activity.  Diabetes medicine.  Over time, maintaining lower blood glucose levels helps lessen symptoms. Sometimes, prescription pain medicine is needed. Follow these instructions at home:  Do not smoke.  Keep your blood glucose level in the range that you and your health care provider have determined acceptable for you.  Keep your blood pressure level in the range that you and your health care provider have determined acceptable for you.  Eat a well-balanced diet.  Be physically active every day. Include strength training and balance exercises.  Protect your feet. ? Check your feet every day for sores, cuts, blisters, or signs of infection. ? Wear padded socks and supportive shoes. Use orthotic inserts, if necessary. ? Regularly check the insides of your shoes for worn spots. Make sure there are no rocks or other items inside your shoes before you put them on. Contact a health care provider if:  You have burning, stabbing, or aching pain in the legs or feet.  You are unable to feel pressure or pain in your feet.  You develop problems  with digestion such as: ? Nausea. ? Vomiting. ? Bloating. ? Constipation. ? Diarrhea. ? Abdominal pain.  You have difficulty with urination, such as: ? Incontinence. ? Retention.  You have palpitations.  You develop orthostatic hypotension. When you stand up you may feel: ? Dizzy. ? Weak. ? Faint.  You cannot attain and maintain  an erection (in men).  You have vaginal dryness and problems with decreased sexual desire and arousal (in women).  You have severe pain in your thighs, legs, or buttocks.  You have unexplained weight loss. This information is not intended to replace advice given to you by your health care provider. Make sure you discuss any questions you have with your health care provider. Document Released: 11/26/2001 Document Revised: 02/23/2016 Document Reviewed: 02/26/2013 Elsevier Interactive Patient Education  2017 [employer]   Memory Compensation Strategies  1. Use "WARM" strategy.  W= write it down  A= associate it  R= repeat it  M= make a mental note  2.   You can keep a Glass blower/designer.  Use a 3-ring notebook with sections for the following: calendar, important names and phone numbers,  medications, doctors' names/phone numbers, lists/reminders, and a section to journal what you did  each day.   3.    Use a calendar to write appointments down.  4.    Write yourself a schedule for the day.  This can be placed on the calendar or in a separate section of the Memory Notebook.  Keeping a  regular schedule can help memory.  5.    Use medication organizer with sections for each day or morning/evening pills.  You may need help loading it  6.    Keep a basket, or pegboard by the door.  Place items that you need to take out with you in the basket or on the pegboard.  You may also want to  include a message board for reminders.  7.    Use sticky notes.  Place sticky notes with reminders in a place where the task is performed.  For example: " turn off the  stove" placed by the stove, "lock the door" placed on the door at eye level, " take your medications" on  the bathroom mirror or by the place where you normally take your medications.  8.    Use alarms/timers.  Use while cooking to remind yourself to check on food or as a reminder to take your medicine, or as a  reminder to make a call, or  as a reminder to perform another task, etc.

## 2018-06-17 ENCOUNTER — Telehealth: Payer: Self-pay | Admitting: Cardiology

## 2018-06-17 ENCOUNTER — Ambulatory Visit (INDEPENDENT_AMBULATORY_CARE_PROVIDER_SITE_OTHER): Payer: Medicare Other | Admitting: *Deleted

## 2018-06-17 DIAGNOSIS — I442 Atrioventricular block, complete: Secondary | ICD-10-CM

## 2018-06-17 NOTE — Telephone Encounter (Signed)
LMOVM reminding pt to send remote transmission.   

## 2018-06-18 NOTE — Progress Notes (Signed)
Remote pacemaker transmission.   

## 2018-07-11 LAB — CUP PACEART REMOTE DEVICE CHECK
Battery Remaining Longevity: 91 mo
Battery Voltage: 3.01 V
Brady Statistic AP VP Percent: 23.13 %
Brady Statistic AP VS Percent: 10.69 %
Brady Statistic AS VP Percent: 40.88 %
Brady Statistic AS VS Percent: 25.3 %
Brady Statistic RV Percent Paced: 63.94 %
Implantable Lead Implant Date: 20171215
Implantable Lead Location: 753859
Implantable Lead Model: 5076
Implantable Pulse Generator Implant Date: 20171215
Lead Channel Impedance Value: 418 Ohm
Lead Channel Impedance Value: 475 Ohm
Lead Channel Impedance Value: 475 Ohm
Lead Channel Pacing Threshold Amplitude: 0.625 V
Lead Channel Pacing Threshold Amplitude: 0.625 V
Lead Channel Sensing Intrinsic Amplitude: 1.75 mV
Lead Channel Sensing Intrinsic Amplitude: 14.625 mV
Lead Channel Sensing Intrinsic Amplitude: 14.625 mV
Lead Channel Setting Pacing Amplitude: 2 V
Lead Channel Setting Pacing Amplitude: 2.5 V
Lead Channel Setting Pacing Pulse Width: 0.4 ms
Lead Channel Setting Sensing Sensitivity: 2.8 mV
MDC IDC LEAD IMPLANT DT: 20171215
MDC IDC LEAD LOCATION: 753860
MDC IDC MSMT LEADCHNL RA IMPEDANCE VALUE: 342 Ohm
MDC IDC MSMT LEADCHNL RA PACING THRESHOLD PULSEWIDTH: 0.4 ms
MDC IDC MSMT LEADCHNL RA SENSING INTR AMPL: 1.75 mV
MDC IDC MSMT LEADCHNL RV PACING THRESHOLD PULSEWIDTH: 0.4 ms
MDC IDC SESS DTM: 20190917202915
MDC IDC STAT BRADY RA PERCENT PACED: 33.8 %

## 2018-09-16 ENCOUNTER — Ambulatory Visit (INDEPENDENT_AMBULATORY_CARE_PROVIDER_SITE_OTHER): Payer: Medicare Other

## 2018-09-16 DIAGNOSIS — R001 Bradycardia, unspecified: Secondary | ICD-10-CM

## 2018-09-16 DIAGNOSIS — I442 Atrioventricular block, complete: Secondary | ICD-10-CM

## 2018-09-16 NOTE — Progress Notes (Signed)
Remote pacemaker transmission.   

## 2018-09-17 ENCOUNTER — Encounter: Payer: Self-pay | Admitting: Cardiology

## 2018-10-19 LAB — CUP PACEART REMOTE DEVICE CHECK
Battery Voltage: 3.02 V
Brady Statistic AP VP Percent: 13.79 %
Brady Statistic AP VS Percent: 16.19 %
Brady Statistic RA Percent Paced: 29.96 %
Date Time Interrogation Session: 20191217155525
Implantable Lead Implant Date: 20171215
Implantable Lead Location: 753860
Implantable Lead Model: 5076
Implantable Lead Model: 5076
Lead Channel Impedance Value: 361 Ohm
Lead Channel Impedance Value: 380 Ohm
Lead Channel Pacing Threshold Amplitude: 0.625 V
Lead Channel Pacing Threshold Pulse Width: 0.4 ms
Lead Channel Pacing Threshold Pulse Width: 0.4 ms
Lead Channel Sensing Intrinsic Amplitude: 7 mV
Lead Channel Sensing Intrinsic Amplitude: 7 mV
Lead Channel Setting Pacing Amplitude: 2 V
Lead Channel Setting Pacing Pulse Width: 0.4 ms
Lead Channel Setting Sensing Sensitivity: 2.8 mV
MDC IDC LEAD IMPLANT DT: 20171215
MDC IDC LEAD LOCATION: 753859
MDC IDC MSMT BATTERY REMAINING LONGEVITY: 91 mo
MDC IDC MSMT LEADCHNL RA IMPEDANCE VALUE: 475 Ohm
MDC IDC MSMT LEADCHNL RA SENSING INTR AMPL: 2 mV
MDC IDC MSMT LEADCHNL RA SENSING INTR AMPL: 2 mV
MDC IDC MSMT LEADCHNL RV IMPEDANCE VALUE: 456 Ohm
MDC IDC MSMT LEADCHNL RV PACING THRESHOLD AMPLITUDE: 0.625 V
MDC IDC PG IMPLANT DT: 20171215
MDC IDC SET LEADCHNL RV PACING AMPLITUDE: 2.5 V
MDC IDC STAT BRADY AS VP PERCENT: 29.79 %
MDC IDC STAT BRADY AS VS PERCENT: 40.23 %
MDC IDC STAT BRADY RV PERCENT PACED: 43.54 %

## 2018-11-11 ENCOUNTER — Ambulatory Visit (INDEPENDENT_AMBULATORY_CARE_PROVIDER_SITE_OTHER): Payer: Medicare Other | Admitting: Internal Medicine

## 2018-11-11 ENCOUNTER — Encounter: Payer: Self-pay | Admitting: Internal Medicine

## 2018-11-11 VITALS — BP 132/82 | HR 66 | Ht 70.0 in | Wt 211.0 lb

## 2018-11-11 DIAGNOSIS — I442 Atrioventricular block, complete: Secondary | ICD-10-CM

## 2018-11-11 DIAGNOSIS — Z950 Presence of cardiac pacemaker: Secondary | ICD-10-CM | POA: Diagnosis not present

## 2018-11-11 DIAGNOSIS — I10 Essential (primary) hypertension: Secondary | ICD-10-CM

## 2018-11-11 NOTE — Patient Instructions (Signed)

## 2018-11-11 NOTE — Progress Notes (Signed)
HPI Marvin Padilla returns today for followup of CHB, s/p PPM insertion. He is a pleasant 78 yo man with syncope and LBBB who developed CHB and underwent PPM insertion in 2017. He has HTN and feels well. He denies chest pain or sob. No syncope.  No Known Allergies   Current Outpatient Medications  Medication Sig Dispense Refill  . amLODipine (NORVASC) 5 MG tablet Take 2.5 mg by mouth daily.    . clobetasol (TEMOVATE) 0.05 % GEL APPLY TO AFFECTED AREA TWICE A DAY  2  . Clotrimazole (LOTRIMIN AF EX) Apply topically as needed.    . diphenhydramine-acetaminophen (TYLENOL PM) 25-500 MG TABS tablet Take 1 tablet by mouth at bedtime.    . docusate sodium (COLACE) 100 MG capsule Take 100 mg by mouth daily.    Marland Kitchen glimepiride (AMARYL) 2 MG tablet Take 2 mg by mouth daily with breakfast.    . lisinopril-hydrochlorothiazide (PRINZIDE,ZESTORETIC) 20-25 MG tablet Take 1 tablet by mouth daily.    Marland Kitchen loratadine (CLARITIN) 10 MG tablet Take 10 mg by mouth daily as needed for allergies or itching.     . metFORMIN (GLUCOPHAGE) 500 MG tablet Take 500 mg by mouth 2 (two) times daily with a meal.    . simvastatin (ZOCOR) 40 MG tablet Take 40 mg by mouth daily.  4  . UNABLE TO FIND Take 1 tablet by mouth daily. Med Name: Jamison Neighbor- DigestZen    . UNABLE TO FIND Take 1 tablet by mouth daily. Med Name: Victorino Sparrow CRS+    . UNABLE TO FIND Take 1 tablet by mouth daily. Med Name: Ardeth Perfect [employer] VMz    . UNABLE TO FIND Take 1 tablet by mouth daily. Med Name: Nilda Simmer     No current facility-administered medications for this visit.      Past Medical History:  Diagnosis Date  . Generalized arthritis   . High cholesterol   . History of left bundle branch block (LBBB) 05/01/2015  . Hypertension   . Pacemaker    a. 09/14/16: CHB s/p PPM: Medtronic (serial number [id number]) pacemaker  . 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   Past Surgical History:  Procedure Laterality Date  . BACK SURGERY    . EP IMPLANTABLE DEVICE N/A 09/14/2016   Procedure: Pacemaker Implant;  Surgeon: Marinus Maw, MD;  Location: [facility] INVASIVE CV LAB;  Service: Cardiovascular;  Laterality: N/A;  . NOSE SURGERY    . PACEMAKER IMPLANT  09/14/2016     Family History  Problem Relation Age of Onset  . Heart failure Mother   . Heart failure Father   . Lung cancer Maternal Grandfather   . Cancer Sister      Social History   Socioeconomic History  . Marital status: Married    Spouse name: Not on file  . Number of children: 2  . Years of education: Not on file  . Highest education level: Associate degree: academic program  Occupational History  . Not on file  Social Needs  . Financial resource strain: Not on file  . Food insecurity:    Worry: Not on file    Inability: Not on file  . Transportation needs:    Medical: Not on file    Non-medical: Not on file  Tobacco Use  . Smoking status: Never Smoker  . Smokeless  tobacco: Never Used  Substance and Sexual Activity  . Alcohol use: No  . Drug use: No  . Sexual activity: Not on file  Lifestyle  . Physical activity:    Days per week: Not on file    Minutes per session: Not on file  . Stress: Not on file  Relationships  . Social connections:    Talks on phone: Not on file    Gets together: Not on file    Attends religious service: Not on file    Active member of club or organization: Not on file    Attends meetings of clubs or organizations: Not on file    Relationship status: Not on file  . Intimate partner violence:    Fear of current or ex partner: Not on file    Emotionally abused: Not on file    Physically abused: Not on file    Forced sexual activity: Not on file  Other Topics Concern  . Not on file  Social History Narrative   Lives at home with wife    Right handed   Caffeine: 1/2 and 1/2 caffeine 2-3 cups in  morning, maybe additional cup of regular in the afternoon     BP 132/82   Pulse 66   Ht 5\' 10"  (1.778 m)   Wt 211 lb (95.7 kg)   BMI 30.28 kg/m   Physical Exam:  Well appearing NAD HEENT: Unremarkable Neck:  No JVD, no thyromegally Lymphatics:  No adenopathy Back:  No CVA tenderness Lungs:  Clear with no wheezes HEART:  Regular rate rhythm, no murmurs, no rubs, no clicks Abd:  soft, positive bowel sounds, no organomegally, no rebound, no guarding Ext:  2 plus pulses, no edema, no cyanosis, no clubbing Skin:  No rashes no nodules Neuro:  CN II through XII intact, motor grossly intact  EKG nsr with P synchronous ventricular pacing  DEVICE  Normal device function.  See PaceArt for details.   Assess/Plan: 1. CHB - He has no escape today. He is asymptomatic s/p PPM insertion. 2. PPM - his medtronic DDD PM is working normally.  3. HTN - his BP is reasonably well controlled. He will continue his current meds. I have asked him to maintain a low sodium diet.  Leonia ReevesGregg Taylor,M.D.

## 2018-11-18 LAB — CUP PACEART INCLINIC DEVICE CHECK
Date Time Interrogation Session: 20200218102411
Implantable Lead Implant Date: 20171215
Implantable Lead Implant Date: 20171215
Implantable Lead Location: 753860
Implantable Pulse Generator Implant Date: 20171215
MDC IDC LEAD LOCATION: 753859

## 2018-12-16 ENCOUNTER — Ambulatory Visit (INDEPENDENT_AMBULATORY_CARE_PROVIDER_SITE_OTHER): Payer: Medicare Other | Admitting: *Deleted

## 2018-12-16 ENCOUNTER — Other Ambulatory Visit: Payer: Self-pay

## 2018-12-16 DIAGNOSIS — I442 Atrioventricular block, complete: Secondary | ICD-10-CM | POA: Diagnosis not present

## 2018-12-17 LAB — CUP PACEART REMOTE DEVICE CHECK
Brady Statistic AP VP Percent: 34.02 %
Brady Statistic AS VP Percent: 65.89 %
Brady Statistic RA Percent Paced: 33.98 %
Implantable Lead Implant Date: 20171215
Implantable Lead Location: 753859
Implantable Lead Location: 753860
Implantable Lead Model: 5076
Implantable Lead Model: 5076
Lead Channel Impedance Value: 342 Ohm
Lead Channel Impedance Value: 380 Ohm
Lead Channel Pacing Threshold Pulse Width: 0.4 ms
Lead Channel Pacing Threshold Pulse Width: 0.4 ms
Lead Channel Sensing Intrinsic Amplitude: 1.75 mV
Lead Channel Sensing Intrinsic Amplitude: 11.875 mV
Lead Channel Setting Pacing Amplitude: 2 V
Lead Channel Setting Pacing Pulse Width: 0.4 ms
MDC IDC LEAD IMPLANT DT: 20171215
MDC IDC MSMT BATTERY REMAINING LONGEVITY: 87 mo
MDC IDC MSMT BATTERY VOLTAGE: 3.01 V
MDC IDC MSMT LEADCHNL RA IMPEDANCE VALUE: 475 Ohm
MDC IDC MSMT LEADCHNL RA PACING THRESHOLD AMPLITUDE: 0.625 V
MDC IDC MSMT LEADCHNL RA SENSING INTR AMPL: 1.75 mV
MDC IDC MSMT LEADCHNL RV IMPEDANCE VALUE: 437 Ohm
MDC IDC MSMT LEADCHNL RV PACING THRESHOLD AMPLITUDE: 0.75 V
MDC IDC MSMT LEADCHNL RV SENSING INTR AMPL: 11.875 mV
MDC IDC PG IMPLANT DT: 20171215
MDC IDC SESS DTM: 20200317125930
MDC IDC SET LEADCHNL RV PACING AMPLITUDE: 2.5 V
MDC IDC SET LEADCHNL RV SENSING SENSITIVITY: 2.8 mV
MDC IDC STAT BRADY AP VS PERCENT: 0.01 %
MDC IDC STAT BRADY AS VS PERCENT: 0.08 %
MDC IDC STAT BRADY RV PERCENT PACED: 99.92 %

## 2018-12-24 ENCOUNTER — Encounter: Payer: Self-pay | Admitting: Cardiology

## 2018-12-24 NOTE — Progress Notes (Signed)
Remote pacemaker transmission.   

## 2019-03-17 ENCOUNTER — Ambulatory Visit (INDEPENDENT_AMBULATORY_CARE_PROVIDER_SITE_OTHER): Payer: Medicare Other | Admitting: *Deleted

## 2019-03-17 DIAGNOSIS — I442 Atrioventricular block, complete: Secondary | ICD-10-CM

## 2019-03-18 ENCOUNTER — Telehealth: Payer: Self-pay

## 2019-03-18 LAB — CUP PACEART REMOTE DEVICE CHECK
Battery Remaining Longevity: 85 mo
Battery Voltage: 3.01 V
Brady Statistic AP VP Percent: 43.84 %
Brady Statistic AP VS Percent: 0.01 %
Brady Statistic AS VP Percent: 56.09 %
Brady Statistic AS VS Percent: 0.07 %
Brady Statistic RA Percent Paced: 43.79 %
Brady Statistic RV Percent Paced: 99.93 %
Date Time Interrogation Session: 20200617140040
Implantable Lead Implant Date: 20171215
Implantable Lead Implant Date: 20171215
Implantable Lead Location: 753859
Implantable Lead Location: 753860
Implantable Lead Model: 5076
Implantable Lead Model: 5076
Implantable Pulse Generator Implant Date: 20171215
Lead Channel Impedance Value: 361 Ohm
Lead Channel Impedance Value: 380 Ohm
Lead Channel Impedance Value: 437 Ohm
Lead Channel Impedance Value: 456 Ohm
Lead Channel Pacing Threshold Amplitude: 0.625 V
Lead Channel Pacing Threshold Amplitude: 0.625 V
Lead Channel Pacing Threshold Pulse Width: 0.4 ms
Lead Channel Pacing Threshold Pulse Width: 0.4 ms
Lead Channel Sensing Intrinsic Amplitude: 2.125 mV
Lead Channel Sensing Intrinsic Amplitude: 2.125 mV
Lead Channel Sensing Intrinsic Amplitude: 9.125 mV
Lead Channel Sensing Intrinsic Amplitude: 9.125 mV
Lead Channel Setting Pacing Amplitude: 2 V
Lead Channel Setting Pacing Amplitude: 2.5 V
Lead Channel Setting Pacing Pulse Width: 0.4 ms
Lead Channel Setting Sensing Sensitivity: 2.8 mV

## 2019-03-18 NOTE — Telephone Encounter (Signed)
Spoke with patient to remind of missed remote transmission 

## 2019-03-28 ENCOUNTER — Encounter: Payer: Self-pay | Admitting: Cardiology

## 2019-03-28 NOTE — Progress Notes (Signed)
Remote pacemaker transmission.   

## 2019-06-16 ENCOUNTER — Ambulatory Visit (INDEPENDENT_AMBULATORY_CARE_PROVIDER_SITE_OTHER): Payer: Medicare Other | Admitting: *Deleted

## 2019-06-16 DIAGNOSIS — I442 Atrioventricular block, complete: Secondary | ICD-10-CM | POA: Diagnosis not present

## 2019-06-16 DIAGNOSIS — I447 Left bundle-branch block, unspecified: Secondary | ICD-10-CM

## 2019-06-16 LAB — CUP PACEART REMOTE DEVICE CHECK
Battery Remaining Longevity: 83 mo
Battery Voltage: 3.01 V
Brady Statistic AP VP Percent: 42.85 %
Brady Statistic AP VS Percent: 0 %
Brady Statistic AS VP Percent: 57.06 %
Brady Statistic AS VS Percent: 0.09 %
Brady Statistic RA Percent Paced: 42.77 %
Brady Statistic RV Percent Paced: 99.91 %
Date Time Interrogation Session: 20200915124603
Implantable Lead Implant Date: 20171215
Implantable Lead Implant Date: 20171215
Implantable Lead Location: 753859
Implantable Lead Location: 753860
Implantable Lead Model: 5076
Implantable Lead Model: 5076
Implantable Pulse Generator Implant Date: 20171215
Lead Channel Impedance Value: 361 Ohm
Lead Channel Impedance Value: 418 Ohm
Lead Channel Impedance Value: 456 Ohm
Lead Channel Impedance Value: 475 Ohm
Lead Channel Pacing Threshold Amplitude: 0.625 V
Lead Channel Pacing Threshold Amplitude: 0.75 V
Lead Channel Pacing Threshold Pulse Width: 0.4 ms
Lead Channel Pacing Threshold Pulse Width: 0.4 ms
Lead Channel Sensing Intrinsic Amplitude: 3.375 mV
Lead Channel Sensing Intrinsic Amplitude: 3.375 mV
Lead Channel Sensing Intrinsic Amplitude: 5 mV
Lead Channel Sensing Intrinsic Amplitude: 5 mV
Lead Channel Setting Pacing Amplitude: 2 V
Lead Channel Setting Pacing Amplitude: 2.5 V
Lead Channel Setting Pacing Pulse Width: 0.4 ms
Lead Channel Setting Sensing Sensitivity: 2.8 mV

## 2019-06-24 NOTE — Progress Notes (Signed)
Remote pacemaker transmission.   

## 2019-06-30 ENCOUNTER — Other Ambulatory Visit: Payer: Self-pay

## 2019-06-30 ENCOUNTER — Ambulatory Visit: Payer: Medicare Other | Admitting: Podiatry

## 2019-06-30 VITALS — Temp 98.0°F

## 2019-06-30 DIAGNOSIS — L84 Corns and callosities: Secondary | ICD-10-CM | POA: Diagnosis not present

## 2019-06-30 DIAGNOSIS — M205X2 Other deformities of toe(s) (acquired), left foot: Secondary | ICD-10-CM

## 2019-06-30 NOTE — Progress Notes (Signed)
Subjective:   Patient ID: Marvin Padilla, male   DOB: 78 y.o.   MRN: [id number]   HPI 78 year old male presents the office today for concerns of toenail fungus he is currently on terbinafine for this that was prescribed by his primary care physician.  He is not having any side effects with medication.  His main concern to me today is pain in the left fourth toe.  He points to the distal aspect of the lateral left fourth toe he gets majority of tenderness with pressure.  States it is uncomfortable in shoes.  He has noticed the toe is curved and he is not interested in having surgery.  He does have a history of infection previously in the left fourth toe.  He said no recent treatment.   Review of Systems  All other systems reviewed and are negative.  Past Medical History:  Diagnosis Date  . Generalized arthritis   . High cholesterol   . History of left bundle branch block (LBBB) 05/01/2015  . Hypertension   . Pacemaker    a. 09/14/16: CHB s/p PPM: Medtronic (serial number [id number]) pacemaker  . 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 BACK SURGERY    . EP IMPLANTABLE DEVICE N/A 09/14/2016   Procedure: Pacemaker Implant;  Surgeon: Marinus Maw, MD;  Location: [facility] INVASIVE CV LAB;  Service: Cardiovascular;  Laterality: N/A;  . NOSE SURGERY    . PACEMAKER IMPLANT  09/14/2016     Current Outpatient Medications:  .  ACCU-CHEK GUIDE test strip, , Disp: , Rfl:  .  Blood Glucose Calibration (ACCU-CHEK GUIDE CONTROL) LIQD, , Disp: , Rfl:  .  Clotrimazole (LOTRIMIN AF EX), Apply topically as needed., Disp: , Rfl:  .  diphenhydramine-acetaminophen (TYLENOL PM) 25-500 MG TABS tablet, Take 1 tablet by mouth at bedtime., Disp: , Rfl:  .  docusate sodium (COLACE) 100 MG capsule, Take 100 mg by mouth daily., Disp: , Rfl:  .  glimepiride (AMARYL) 2 MG tablet, Take 2 mg by mouth daily with breakfast., Disp: ,  Rfl:  .  lisinopril-hydrochlorothiazide (PRINZIDE,ZESTORETIC) 20-25 MG tablet, Take 1 tablet by mouth daily., Disp: , Rfl:  .  metFORMIN (GLUCOPHAGE) 500 MG tablet, Take 500 mg by mouth 2 (two) times daily with a meal., Disp: , Rfl:  .  methylPREDNISolone (MEDROL DOSEPAK) 4 MG TBPK tablet, TAKE 1 TABLET BY MOUTH AS DIRECTED USE AS DIRECTED IN MEDROL DOSE PAK, Disp: , Rfl:  .  simvastatin (ZOCOR) 40 MG tablet, Take 40 mg by mouth daily., Disp: , Rfl: 4 .  terbinafine (LAMISIL) 250 MG tablet, , Disp: , Rfl:  .  terbinafine (LAMISIL) 250 MG tablet, , Disp: , Rfl:  .  amLODipine (NORVASC) 5 MG tablet, Take 2.5 mg by mouth daily., Disp: , Rfl:  .  clobetasol (TEMOVATE) 0.05 % GEL, APPLY TO AFFECTED AREA TWICE A DAY, Disp: , Rfl: 2 .  loratadine (CLARITIN) 10 MG tablet, Take 10 mg by mouth daily as needed for allergies or itching. , Disp: , Rfl:  .  UNABLE TO FIND, Take 1 tablet by mouth daily. Med Name: Jamison Neighbor- DigestZen, Disp: , Rfl:  .  UNABLE TO FIND, Take 1 tablet by mouth daily. Med Name: Victorino Sparrow CRS+, Disp: , Rfl:  .  UNABLE TO FIND, Take 1 tablet by mouth daily. Med Name: [facility] [employer] VMz, Disp: , Rfl:  .  UNABLE TO FIND, Take 1 tablet by mouth daily. Med Name: Derek Jack xEOMega, Disp: , Rfl:   No Known Allergies        Objective:  Physical Exam  General: AAO x3, NAD  Dermatological: Nails are hypertrophic and dystrophic with yellow-brown discoloration.  The nails do appear to have fungal infection but also dystrophy to the nails.  No pain in the toenails.  The keratotic lesion present on the distal lateral aspect left fourth toe which is adjacent the toenail.  Upon debridement there is no underlying ulceration drainage or any signs of infection.  Vascular: Dorsalis Pedis artery and Posterior Tibial artery pedal pulses are 2/4 bilateral with immedate capillary fill time.  There is no pain with calf compression, swelling, warmth, erythema.   Neruologic: Grossly intact  via light touch bilateral.Protective threshold with Semmes Wienstein monofilament intact to all pedal sites bilateral.   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  Gait: Unassisted, Nonantalgic.       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  Dispensed offloading pads.  No active signs of infection today but continue to monitor.  RTC prn  Trula Slade DPM

## 2019-09-15 ENCOUNTER — Ambulatory Visit (INDEPENDENT_AMBULATORY_CARE_PROVIDER_SITE_OTHER): Payer: Medicare Other | Admitting: *Deleted

## 2019-09-15 DIAGNOSIS — I442 Atrioventricular block, complete: Secondary | ICD-10-CM | POA: Diagnosis not present

## 2019-09-15 LAB — CUP PACEART REMOTE DEVICE CHECK
Battery Remaining Longevity: 81 mo
Battery Voltage: 3.01 V
Brady Statistic AP VP Percent: 36.49 %
Brady Statistic AP VS Percent: 0 %
Brady Statistic AS VP Percent: 63.48 %
Brady Statistic AS VS Percent: 0.03 %
Brady Statistic RA Percent Paced: 36.46 %
Brady Statistic RV Percent Paced: 99.96 %
Date Time Interrogation Session: 20201215105018
Implantable Lead Implant Date: 20171215
Implantable Lead Implant Date: 20171215
Implantable Lead Location: 753859
Implantable Lead Location: 753860
Implantable Lead Model: 5076
Implantable Lead Model: 5076
Implantable Pulse Generator Implant Date: 20171215
Lead Channel Impedance Value: 361 Ohm
Lead Channel Impedance Value: 380 Ohm
Lead Channel Impedance Value: 437 Ohm
Lead Channel Impedance Value: 456 Ohm
Lead Channel Pacing Threshold Amplitude: 0.5 V
Lead Channel Pacing Threshold Amplitude: 0.625 V
Lead Channel Pacing Threshold Pulse Width: 0.4 ms
Lead Channel Pacing Threshold Pulse Width: 0.4 ms
Lead Channel Sensing Intrinsic Amplitude: 2.375 mV
Lead Channel Sensing Intrinsic Amplitude: 2.375 mV
Lead Channel Sensing Intrinsic Amplitude: 7.5 mV
Lead Channel Sensing Intrinsic Amplitude: 7.5 mV
Lead Channel Setting Pacing Amplitude: 2 V
Lead Channel Setting Pacing Amplitude: 2.5 V
Lead Channel Setting Pacing Pulse Width: 0.4 ms
Lead Channel Setting Sensing Sensitivity: 2.8 mV

## 2019-10-17 NOTE — Progress Notes (Signed)
PPM Remote  

## 2019-11-18 ENCOUNTER — Other Ambulatory Visit: Payer: Self-pay

## 2019-11-18 ENCOUNTER — Encounter: Payer: Self-pay | Admitting: Internal Medicine

## 2019-11-18 ENCOUNTER — Ambulatory Visit: Payer: Medicare Other | Admitting: Internal Medicine

## 2019-11-18 VITALS — BP 128/72 | HR 72 | Ht 70.0 in | Wt 209.0 lb

## 2019-11-18 DIAGNOSIS — I442 Atrioventricular block, complete: Secondary | ICD-10-CM

## 2019-11-18 DIAGNOSIS — I10 Essential (primary) hypertension: Secondary | ICD-10-CM

## 2019-11-18 DIAGNOSIS — Z950 Presence of cardiac pacemaker: Secondary | ICD-10-CM

## 2019-11-18 NOTE — Progress Notes (Signed)
HPI Marvin Padilla returns today for followup. He is a pleasant 79 yo man with CHB who had recovery of his AV conduction and we reprogrammed his PPM. In the interim, he has had some worsening of his sob with exertion. No edema. No syncope. He denies medical or dietary non-compliance. He has a programmed AV delay of 350 and is pacing in the ventricle almost 100%. No chest pain. No Known Allergies   Current Outpatient Medications  Medication Sig Dispense Refill  . ACCU-CHEK GUIDE test strip     . Blood Glucose Calibration (ACCU-CHEK GUIDE CONTROL) LIQD     . Clotrimazole (LOTRIMIN AF EX) Apply topically as needed.    . diphenhydramine-acetaminophen (TYLENOL PM) 25-500 MG TABS tablet Take 1 tablet by mouth at bedtime.    . docusate sodium (COLACE) 100 MG capsule Take 100 mg by mouth daily.    Marland Kitchen glimepiride (AMARYL) 2 MG tablet Take 2 mg by mouth daily with breakfast.    . lisinopril-hydrochlorothiazide (PRINZIDE,ZESTORETIC) 20-25 MG tablet Take 1 tablet by mouth daily.    . metFORMIN (GLUCOPHAGE) 500 MG tablet Take 500 mg by mouth 2 (two) times daily with a meal.    . simvastatin (ZOCOR) 40 MG tablet Take 40 mg by mouth daily.  4  . terbinafine (LAMISIL) 250 MG tablet      No current facility-administered medications for this visit.     Past Medical History:  Diagnosis Date  . Generalized arthritis   . High cholesterol   . History of left bundle branch block (LBBB) 05/01/2015  . Hypertension   . Pacemaker    a. 09/14/16: CHB s/p PPM: Medtronic (serial number [id number]) pacemaker  . 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   Past Surgical History:  Procedure Laterality Date  . BACK SURGERY    . EP IMPLANTABLE DEVICE N/A 09/14/2016   Procedure: Pacemaker Implant;  Surgeon: Marinus Maw, MD;  Location: [facility] INVASIVE CV LAB;  Service: Cardiovascular;  Laterality: N/A;  . NOSE  SURGERY    . PACEMAKER IMPLANT  09/14/2016     Family History  Problem Relation Age of Onset  . Heart failure Mother   . Heart failure Father   . Lung cancer Maternal Grandfather   . Cancer Sister      Social History   Socioeconomic History  . Marital status: Married    Spouse name: Not on file  . Number of children: 2  . Years of education: Not on file  . Highest education level: Associate degree: academic program  Occupational History  . Not on file  Tobacco Use  . Smoking status: Never Smoker  . Smokeless tobacco: Never Used  Substance and Sexual Activity  . Alcohol use: No  . Drug use: No  . Sexual activity: Not on file  Other Topics Concern  . Not on file  Social History Narrative   Lives at home with wife    Right handed   Caffeine: 1/2 and 1/2 caffeine 2-3 cups in morning, maybe additional cup of regular in the afternoon   Social Determinants of Health   Financial Resource Strain:   . Difficulty of Paying Living Expenses: Not on file  Food Insecurity:   . Worried About Programme researcher, broadcasting/film/video in the Last Year: Not on file  . Ran Out of Food in the  Last Year: Not on file  Transportation Needs:   . Lack of Transportation (Medical): Not on file  . Lack of Transportation (Non-Medical): Not on file  Physical Activity:   . Days of Exercise per Week: Not on file  . Minutes of Exercise per Session: Not on file  Stress:   . Feeling of Stress : Not on file  Social Connections:   . Frequency of Communication with Friends and Family: Not on file  . Frequency of Social Gatherings with Friends and Family: Not on file  . Attends Religious Services: Not on file  . Active Member of Clubs or Organizations: Not on file  . Attends Archivist Meetings: Not on file  . Marital Status: Not on file  Intimate Partner Violence:   . Fear of Current or Ex-Partner: Not on file  . Emotionally Abused: Not on file  . Physically Abused: Not on file  . Sexually Abused: Not  on file     BP 128/72   Pulse 72   Ht 5\' 10"  (1.778 m)   Wt 209 lb (94.8 kg)   SpO2 98%   BMI 29.99 kg/m   Physical Exam:  Well appearing NAD HEENT: Unremarkable Neck:  No JVD, no thyromegally Lymphatics:  No adenopathy Back:  No CVA tenderness Lungs:  Clear with no wheezes HEART:  Regular rate rhythm, no murmurs, no rubs, no clicks Abd:  soft, positive bowel sounds, no organomegally, no rebound, no guarding Ext:  2 plus pulses, no edema, no cyanosis, no clubbing Skin:  No rashes no nodules Neuro:  CN II through XII intact, motor grossly intact  EKG - NSR with Ventricular pacing  DEVICE  Normal device function.  See PaceArt for details.   Assess/Plan: 1. CHB - he has not had syncope. He is pacing with an AV delay of 350 and we will reprogram his AV delays to 160/180.  2. PPM - his medtronic DDD PM is working normally. We will increase his AV delay. 3. HTN - his bp is well controlled. No change. 4. 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 He has class 2 symptoms. I will see if he does better with a physiologic AV delay. If not he will need some more potent diuretic.  Marvin Padilla.D.

## 2019-11-18 NOTE — Patient Instructions (Addendum)
Medication Instructions:  Your physician recommends that you continue on your current medications as directed. Please refer to the Current Medication list given to you today.  Labwork: None ordered.  Testing/Procedures: None ordered.  Follow-Up: Your physician wants you to follow-up in: 6 months with Dr. Ladona Ridgel.   You will receive a reminder letter in the mail two months in advance. If you don't receive a letter, please call our office to schedule the follow-up appointment.  Remote monitoring is used to monitor your Pacemaker from home. This monitoring reduces the number of office visits required to check your device to one time per year. It allows [place] to keep an eye on the functioning of your device to ensure it is working properly. You are scheduled for a device check from home on 12/15/2019. You may send your transmission at any time that day. If you have a wireless device, the transmission will be sent automatically. After your physician reviews your transmission, you will receive a postcard with your next transmission date.  Any Other Special Instructions Will Be Listed Below (If Applicable).  If you need a refill on your cardiac medications before your next appointment, please call your pharmacy.

## 2019-12-15 ENCOUNTER — Ambulatory Visit (INDEPENDENT_AMBULATORY_CARE_PROVIDER_SITE_OTHER): Payer: Medicare Other | Admitting: *Deleted

## 2019-12-15 DIAGNOSIS — I442 Atrioventricular block, complete: Secondary | ICD-10-CM | POA: Diagnosis not present

## 2019-12-15 LAB — CUP PACEART REMOTE DEVICE CHECK
Battery Remaining Longevity: 79 mo
Battery Voltage: 3.01 V
Brady Statistic AP VP Percent: 45.67 %
Brady Statistic AP VS Percent: 0 %
Brady Statistic AS VP Percent: 54.26 %
Brady Statistic AS VS Percent: 0.07 %
Brady Statistic RA Percent Paced: 45.63 %
Brady Statistic RV Percent Paced: 99.87 %
Date Time Interrogation Session: 20210316080121
Implantable Lead Implant Date: 20171215
Implantable Lead Implant Date: 20171215
Implantable Lead Location: 753859
Implantable Lead Location: 753860
Implantable Lead Model: 5076
Implantable Lead Model: 5076
Implantable Pulse Generator Implant Date: 20171215
Lead Channel Impedance Value: 361 Ohm
Lead Channel Impedance Value: 361 Ohm
Lead Channel Impedance Value: 418 Ohm
Lead Channel Impedance Value: 456 Ohm
Lead Channel Pacing Threshold Amplitude: 0.625 V
Lead Channel Pacing Threshold Amplitude: 0.75 V
Lead Channel Pacing Threshold Pulse Width: 0.4 ms
Lead Channel Pacing Threshold Pulse Width: 0.4 ms
Lead Channel Sensing Intrinsic Amplitude: 3 mV
Lead Channel Sensing Intrinsic Amplitude: 3 mV
Lead Channel Sensing Intrinsic Amplitude: 4.875 mV
Lead Channel Sensing Intrinsic Amplitude: 4.875 mV
Lead Channel Setting Pacing Amplitude: 2 V
Lead Channel Setting Pacing Amplitude: 2.5 V
Lead Channel Setting Pacing Pulse Width: 0.4 ms
Lead Channel Setting Sensing Sensitivity: 2.8 mV

## 2019-12-15 NOTE — Progress Notes (Signed)
PPM Remote  

## 2019-12-18 DIAGNOSIS — M2022 Hallux rigidus, left foot: Secondary | ICD-10-CM | POA: Insufficient documentation

## 2020-03-15 ENCOUNTER — Ambulatory Visit (INDEPENDENT_AMBULATORY_CARE_PROVIDER_SITE_OTHER): Payer: Medicare Other | Admitting: *Deleted

## 2020-03-15 DIAGNOSIS — I442 Atrioventricular block, complete: Secondary | ICD-10-CM

## 2020-03-16 LAB — CUP PACEART REMOTE DEVICE CHECK
Battery Remaining Longevity: 70 mo
Battery Voltage: 3 V
Brady Statistic AP VP Percent: 43.43 %
Brady Statistic AP VS Percent: 0.01 %
Brady Statistic AS VP Percent: 56.5 %
Brady Statistic AS VS Percent: 0.06 %
Brady Statistic RA Percent Paced: 43.4 %
Brady Statistic RV Percent Paced: 99.86 %
Date Time Interrogation Session: 20210615092450
Implantable Lead Implant Date: 20171215
Implantable Lead Implant Date: 20171215
Implantable Lead Location: 753859
Implantable Lead Location: 753860
Implantable Lead Model: 5076
Implantable Lead Model: 5076
Implantable Pulse Generator Implant Date: 20171215
Lead Channel Impedance Value: 361 Ohm
Lead Channel Impedance Value: 361 Ohm
Lead Channel Impedance Value: 437 Ohm
Lead Channel Impedance Value: 456 Ohm
Lead Channel Pacing Threshold Amplitude: 0.625 V
Lead Channel Pacing Threshold Amplitude: 0.75 V
Lead Channel Pacing Threshold Pulse Width: 0.4 ms
Lead Channel Pacing Threshold Pulse Width: 0.4 ms
Lead Channel Sensing Intrinsic Amplitude: 3 mV
Lead Channel Sensing Intrinsic Amplitude: 3 mV
Lead Channel Sensing Intrinsic Amplitude: 7.375 mV
Lead Channel Sensing Intrinsic Amplitude: 7.375 mV
Lead Channel Setting Pacing Amplitude: 2 V
Lead Channel Setting Pacing Amplitude: 2.5 V
Lead Channel Setting Pacing Pulse Width: 0.4 ms
Lead Channel Setting Sensing Sensitivity: 2.8 mV

## 2020-03-16 NOTE — Progress Notes (Signed)
Remote pacemaker transmission.   

## 2020-04-05 ENCOUNTER — Other Ambulatory Visit (HOSPITAL_COMMUNITY): Payer: Self-pay | Admitting: Endocrinology

## 2020-04-05 DIAGNOSIS — R0989 Other specified symptoms and signs involving the circulatory and respiratory systems: Secondary | ICD-10-CM

## 2020-04-06 ENCOUNTER — Other Ambulatory Visit: Payer: Self-pay

## 2020-04-06 ENCOUNTER — Ambulatory Visit (HOSPITAL_COMMUNITY)
Admission: RE | Admit: 2020-04-06 | Discharge: 2020-04-06 | Disposition: A | Discharge status: Home / Self Care | Payer: Medicare Other | Source: Ambulatory Visit | Attending: Vascular Surgery | Admitting: Vascular Surgery

## 2020-04-06 DIAGNOSIS — R0989 Other specified symptoms and signs involving the circulatory and respiratory systems: Secondary | ICD-10-CM | POA: Diagnosis not present

## 2020-05-31 ENCOUNTER — Ambulatory Visit: Payer: Medicare Other | Admitting: Internal Medicine

## 2020-05-31 ENCOUNTER — Encounter: Payer: Self-pay | Admitting: Internal Medicine

## 2020-05-31 ENCOUNTER — Other Ambulatory Visit: Payer: Self-pay

## 2020-05-31 VITALS — BP 136/70 | HR 71 | Ht 70.0 in

## 2020-05-31 DIAGNOSIS — I442 Atrioventricular block, complete: Secondary | ICD-10-CM | POA: Diagnosis not present

## 2020-05-31 DIAGNOSIS — Z950 Presence of cardiac pacemaker: Secondary | ICD-10-CM | POA: Diagnosis not present

## 2020-05-31 DIAGNOSIS — I10 Essential (primary) hypertension: Secondary | ICD-10-CM | POA: Diagnosis not present

## 2020-05-31 LAB — CUP PACEART INCLINIC DEVICE CHECK
Battery Remaining Longevity: 68 mo
Battery Voltage: 3 V
Brady Statistic AP VP Percent: 40.65 %
Brady Statistic AP VS Percent: 0.01 %
Brady Statistic AS VP Percent: 59.29 %
Brady Statistic AS VS Percent: 0.05 %
Brady Statistic RA Percent Paced: 40.62 %
Brady Statistic RV Percent Paced: 99.87 %
Date Time Interrogation Session: 20210831152044
Implantable Lead Implant Date: 20171215
Implantable Lead Implant Date: 20171215
Implantable Lead Location: 753859
Implantable Lead Location: 753860
Implantable Lead Model: 5076
Implantable Lead Model: 5076
Implantable Pulse Generator Implant Date: 20171215
Lead Channel Impedance Value: 361 Ohm
Lead Channel Impedance Value: 399 Ohm
Lead Channel Impedance Value: 456 Ohm
Lead Channel Impedance Value: 475 Ohm
Lead Channel Pacing Threshold Amplitude: 0.5 V
Lead Channel Pacing Threshold Amplitude: 0.625 V
Lead Channel Pacing Threshold Pulse Width: 0.4 ms
Lead Channel Pacing Threshold Pulse Width: 0.4 ms
Lead Channel Sensing Intrinsic Amplitude: 3.25 mV
Lead Channel Sensing Intrinsic Amplitude: 7.75 mV
Lead Channel Setting Pacing Amplitude: 2 V
Lead Channel Setting Pacing Amplitude: 2.5 V
Lead Channel Setting Pacing Pulse Width: 0.4 ms
Lead Channel Setting Sensing Sensitivity: 2.8 mV

## 2020-05-31 NOTE — Patient Instructions (Signed)
Medication Instructions:  Your physician recommends that you continue on your current medications as directed. Please refer to the Current Medication list given to you today.  Labwork: None ordered.  Testing/Procedures: None ordered.  Follow-Up: Your physician wants you to follow-up in: one year with Dr. Ladona Ridgel.   You will receive a reminder letter in the mail two months in advance. If you don't receive a letter, please call our office to schedule the follow-up appointment.  Remote monitoring is used to monitor your Pacemaker from home. This monitoring reduces the number of office visits required to check your device to one time per year. It allows [place] to keep an eye on the functioning of your device to ensure it is working properly. You are scheduled for a device check from home on 06/14/2020. You may send your transmission at any time that day. If you have a wireless device, the transmission will be sent automatically. After your physician reviews your transmission, you will receive a postcard with your next transmission date.  Any Other Special Instructions Will Be Listed Below (If Applicable).  If you need a refill on your cardiac medications before your next appointment, please call your pharmacy.

## 2020-05-31 NOTE — Progress Notes (Signed)
HPI [name] returns today for followup. He is a pleasant 79 yo man with CHB who had recovery of his AV conduction and we reprogrammed his PPM. In the interim, he has had some worsening of his sob with exertion. No edema. No syncope. He denies medical or dietary non-compliance. He has a programmed AV delay of 350 but then reprogrammed back to 160/180. He has dyspnea with exertion. He also c/o feeling cold.  No chest pain. No Known Allergies   Current Outpatient Medications  Medication Sig Dispense Refill  . ACCU-CHEK GUIDE test strip     . Blood Glucose Calibration (ACCU-CHEK GUIDE CONTROL) LIQD     . Clotrimazole (LOTRIMIN AF EX) Apply topically as needed.    . diphenhydramine-acetaminophen (TYLENOL PM) 25-500 MG TABS tablet Take 1 tablet by mouth at bedtime.    . docusate sodium (COLACE) 100 MG capsule Take 100 mg by mouth daily.    Marland Kitchen glimepiride (AMARYL) 2 MG tablet Take 2 mg by mouth daily with breakfast.    . lisinopril-hydrochlorothiazide (PRINZIDE,ZESTORETIC) 20-25 MG tablet Take 1 tablet by mouth daily.    . metFORMIN (GLUCOPHAGE) 500 MG tablet Take 500 mg by mouth 2 (two) times daily with a meal.    . simvastatin (ZOCOR) 40 MG tablet Take 40 mg by mouth daily.  4  . terbinafine (LAMISIL) 250 MG tablet      No current facility-administered medications for this visit.     Past Medical History:  Diagnosis Date  . Generalized arthritis   . High cholesterol   . History of left bundle branch block (LBBB) 05/01/2015  . Hypertension   . Pacemaker    a. 09/14/16: CHB s/p PPM: Medtronic (serial number [id number]) pacemaker  . 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   Past Surgical History:  Procedure Laterality Date  . BACK SURGERY    . EP IMPLANTABLE DEVICE N/A 09/14/2016   Procedure: Pacemaker Implant;  Surgeon: Marinus Maw, MD;  Location: [facility] INVASIVE CV LAB;   Service: Cardiovascular;  Laterality: N/A;  . NOSE SURGERY    . PACEMAKER IMPLANT  09/14/2016     Family History  Problem Relation Age of Onset  . Heart failure Mother   . Heart failure Father   . Lung cancer Maternal Grandfather   . Cancer Sister      Social History   Socioeconomic History  . Marital status: Married    Spouse name: Not on file  . Number of children: 2  . Years of education: Not on file  . Highest education level: Associate degree: academic program  Occupational History  . Not on file  Tobacco Use  . Smoking status: Never Smoker  . Smokeless tobacco: Never Used  Vaping Use  . Vaping Use: Never used  Substance and Sexual Activity  . Alcohol use: No  . Drug use: No  . Sexual activity: Not on file  Other Topics Concern  . Not on file  Social History Narrative   Lives at home with wife    Right handed   Caffeine: 1/2 and 1/2 caffeine 2-3 cups in morning, maybe additional cup of regular in the afternoon   Social Determinants of Health   Financial Resource Strain:   . Difficulty of Paying Living Expenses: Not on file  Food Insecurity:   . Worried About Running  Out of Food in the Last Year: Not on file  . Ran Out of Food in the Last Year: Not on file  Transportation Needs:   . Lack of Transportation (Medical): Not on file  . Lack of Transportation (Non-Medical): Not on file  Physical Activity:   . Days of Exercise per Week: Not on file  . Minutes of Exercise per Session: Not on file  Stress:   . Feeling of Stress : Not on file  Social Connections:   . Frequency of Communication with Friends and Family: Not on file  . Frequency of Social Gatherings with Friends and Family: Not on file  . Attends Religious Services: Not on file  . Active Member of Clubs or Organizations: Not on file  . Attends Banker Meetings: Not on file  . Marital Status: Not on file  Intimate Partner Violence:   . Fear of Current or Ex-Partner: Not on file  .  Emotionally Abused: Not on file  . Physically Abused: Not on file  . Sexually Abused: Not on file     BP 136/70   Pulse 71   Ht 5\' 10"  (1.778 m)   SpO2 96%   BMI 29.99 kg/m   Physical Exam:  Well appearing NAD HEENT: Unremarkable Neck:  No JVD, no thyromegally Lymphatics:  No adenopathy Back:  No CVA tenderness Lungs:  Clear with no wheezes HEART:  Regular rate rhythm, no murmurs, no rubs, no clicks Abd:  soft, positive bowel sounds, no organomegally, no rebound, no guarding Ext:  2 plus pulses, no edema, no cyanosis, no clubbing Skin:  No rashes no nodules Neuro:  CN II through XII intact, motor grossly intact  DEVICE  Normal device function.  See PaceArt for details.   Assess/Plan: 1. CHB - his conduction remains absent. He is asymptomatic, s/p PPM  2. PPM - his medtronic device is working normally. We upped his rate response as his HR only increase by 10 with exertion  Today. 3. HTN - his bp is only minimally elevated. We will follow. 4. Dyslipidemia - he will continue his statin therapy.  Marvin Davis,MD

## 2020-06-14 ENCOUNTER — Ambulatory Visit (INDEPENDENT_AMBULATORY_CARE_PROVIDER_SITE_OTHER): Payer: Medicare Other | Admitting: *Deleted

## 2020-06-14 DIAGNOSIS — I442 Atrioventricular block, complete: Secondary | ICD-10-CM

## 2020-06-14 LAB — CUP PACEART REMOTE DEVICE CHECK
Battery Remaining Longevity: 68 mo
Battery Voltage: 3 V
Brady Statistic AP VP Percent: 29.36 %
Brady Statistic AP VS Percent: 0 %
Brady Statistic AS VP Percent: 70.6 %
Brady Statistic AS VS Percent: 0.04 %
Brady Statistic RA Percent Paced: 29.36 %
Brady Statistic RV Percent Paced: 99.94 %
Date Time Interrogation Session: 20210914083459
Implantable Lead Implant Date: 20171215
Implantable Lead Implant Date: 20171215
Implantable Lead Location: 753859
Implantable Lead Location: 753860
Implantable Lead Model: 5076
Implantable Lead Model: 5076
Implantable Pulse Generator Implant Date: 20171215
Lead Channel Impedance Value: 342 Ohm
Lead Channel Impedance Value: 361 Ohm
Lead Channel Impedance Value: 437 Ohm
Lead Channel Impedance Value: 456 Ohm
Lead Channel Pacing Threshold Amplitude: 0.625 V
Lead Channel Pacing Threshold Amplitude: 0.75 V
Lead Channel Pacing Threshold Pulse Width: 0.4 ms
Lead Channel Pacing Threshold Pulse Width: 0.4 ms
Lead Channel Sensing Intrinsic Amplitude: 1.75 mV
Lead Channel Sensing Intrinsic Amplitude: 1.75 mV
Lead Channel Sensing Intrinsic Amplitude: 6.5 mV
Lead Channel Sensing Intrinsic Amplitude: 6.5 mV
Lead Channel Setting Pacing Amplitude: 2 V
Lead Channel Setting Pacing Amplitude: 2.5 V
Lead Channel Setting Pacing Pulse Width: 0.4 ms
Lead Channel Setting Sensing Sensitivity: 2.8 mV

## 2020-06-16 NOTE — Progress Notes (Signed)
Remote pacemaker transmission.   

## 2020-09-02 ENCOUNTER — Ambulatory Visit (HOSPITAL_COMMUNITY)
Admission: EM | Admit: 2020-09-02 | Discharge: 2020-09-02 | Disposition: A | Discharge status: Home / Self Care | Payer: Medicare Other | Attending: Emergency Medicine | Admitting: Emergency Medicine

## 2020-09-02 ENCOUNTER — Encounter (HOSPITAL_COMMUNITY): Payer: Self-pay | Admitting: Emergency Medicine

## 2020-09-02 ENCOUNTER — Other Ambulatory Visit: Payer: Self-pay

## 2020-09-02 DIAGNOSIS — J0110 Acute frontal sinusitis, unspecified: Secondary | ICD-10-CM | POA: Insufficient documentation

## 2020-09-02 DIAGNOSIS — Z20822 Contact with and (suspected) exposure to covid-19: Secondary | ICD-10-CM | POA: Insufficient documentation

## 2020-09-02 LAB — SARS CORONAVIRUS 2 (TAT 6-24 HRS): SARS Coronavirus 2: NEGATIVE

## 2020-09-02 MED ORDER — AMOXICILLIN-POT CLAVULANATE 875-125 MG PO TABS: 1.0000 | ORAL_TABLET | Freq: Two times a day (BID) | ORAL | 0 refills | Status: AC

## 2020-09-02 NOTE — Discharge Instructions (Signed)
Push fluids to ensure adequate hydration and keep secretions thin.  Tylenol and/or ibuprofen as needed for pain or fevers.  Over the counter medications such as nasal sprays or mucinex as needed to help with symptoms.  Complete course of antibiotics.  If symptoms worsen or do not improve in the next week to return to be seen or to follow up with your PCP.

## 2020-09-02 NOTE — ED Triage Notes (Signed)
Pt presents with sore throat and sinus pain xs 1 week.

## 2020-09-02 NOTE — ED Provider Notes (Signed)
[facility]    CSN: [id number] Arrival date & time: 09/02/20  0800      History   Chief Complaint Chief Complaint  Patient presents with  . Sore Throat    HPI Marvin Padilla is a 79 y.o. male.   Marvin Padilla presents with complaints of sore throat, congestion, some cough, and facial pressure which started 1 week ago. No shortness of breath . No known fevers. Family members at home have been ill as well. This morning had a difficult time producing post nasal drip which caused him to feel like he couldn't swallow, briefly. Has been vaccinated for covid-19. Has been gargling with salt water which hasn't helped. No gi symptoms. No ear pain. No other medications for symptoms.   ROS per HPI, negative if not otherwise mentioned.      Past Medical History:  Diagnosis Date  . Generalized arthritis   . High cholesterol   . History of left bundle branch block (LBBB) 05/01/2015  . Hypertension   . Pacemaker    a. 09/14/16: CHB s/p PPM: Medtronic (serial number [id number]) pacemaker  . 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 Pacemaker   . 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 LBBB (left bundle branch block) 05/01/2015  . Essential hypertension   . Bradycardia 04/30/2015  . High cholesterol   . TBI (traumatic brain injury) [facility])     Past Surgical History:  Procedure Laterality Date  . BACK SURGERY    . EP IMPLANTABLE DEVICE N/A 09/14/2016   Procedure: Pacemaker Implant;  Surgeon: Marinus Maw, MD;  Location: [facility] INVASIVE CV LAB;  Service: Cardiovascular;  Laterality: N/A;  . NOSE SURGERY    . PACEMAKER IMPLANT  09/14/2016       Home Medications    Prior to Admission medications   Medication Sig Start Date End Date Taking? Authorizing Provider  ACCU-CHEK GUIDE test strip  06/22/19   [provider]    amoxicillin-clavulanate (AUGMENTIN) 875-125 MG tablet Take 1 tablet by mouth every 12 (twelve) hours for 10 days. 09/02/20 09/12/20  Georgetta Haber, NP  Blood Glucose Calibration (ACCU-CHEK GUIDE CONTROL) LIQD  06/22/19   [provider]  Clotrimazole (LOTRIMIN AF EX) Apply topically as needed.    [provider]  diphenhydramine-acetaminophen (TYLENOL PM) 25-500 MG TABS tablet Take 1 tablet by mouth at bedtime.    [provider]  docusate sodium (COLACE) 100 MG capsule Take 100 mg by mouth daily.    [provider]  glimepiride (AMARYL) 2 MG tablet Take 2 mg by mouth daily with breakfast.    [provider]  lisinopril-hydrochlorothiazide (PRINZIDE,ZESTORETIC) 20-25 MG tablet Take 1 tablet by mouth daily. 09/07/16   [provider]  metFORMIN (GLUCOPHAGE) 500 MG tablet Take 500 mg by mouth 2 (two) times daily with a meal.    [provider]  simvastatin (ZOCOR) 40 MG tablet Take 40 mg by mouth daily. 04/02/15   [provider]  terbinafine (LAMISIL) 250 MG tablet  06/29/19   [provider]    Family History Family History  Problem Relation Age of Onset  . Heart failure Mother   . Heart failure Father   . Lung cancer Maternal Grandfather   . Cancer Sister     Social History Social History   Tobacco  Use  . Smoking status: Never Smoker  . Smokeless tobacco: Never Used  Vaping Use  . Vaping Use: Never used  Substance Use Topics  . Alcohol use: No  . Drug use: No     Allergies   Patient has no known allergies.   Review of Systems Review of Systems   Physical Exam Triage Vital Signs ED Triage Vitals  Enc Vitals Group     BP 09/02/20 0816 137/77     Pulse Rate 09/02/20 0816 69     Resp 09/02/20 0816 17     Temp 09/02/20 0816 97.9 F (36.6 C)     Temp Source 09/02/20 0816 Oral     SpO2 09/02/20 0816 96 %     Weight --      Height --      Head Circumference --      Peak Flow --      Pain  Score 09/02/20 0814 7     Pain Loc --      Pain Edu? --      Excl. in GC? --    No data found.  Updated Vital Signs BP 137/77 (BP Location: Left Arm)   Pulse 69   Temp 97.9 F (36.6 C) (Oral)   Resp 17   SpO2 96%   Visual Acuity Right Eye Distance:   Left Eye Distance:   Bilateral Distance:    Right Eye Near:   Left Eye Near:    Bilateral Near:     Physical Exam Constitutional:      Appearance: He is well-developed.  HENT:     Right Ear: Tympanic membrane and ear canal normal.     Left Ear: Tympanic membrane and ear canal normal.     Nose:     Right Sinus: Frontal sinus tenderness present.     Left Sinus: Frontal sinus tenderness present.     Mouth/Throat:     Mouth: Mucous membranes are moist.     Pharynx: Posterior oropharyngeal erythema present.     Tonsils: No tonsillar exudate. 1+ on the right. 1+ on the left.  Cardiovascular:     Rate and Rhythm: Normal rate.  Pulmonary:     Effort: Pulmonary effort is normal.  Lymphadenopathy:     Cervical: No cervical adenopathy.  Skin:    General: Skin is warm and dry.  Neurological:     Mental Status: He is alert and oriented to person, place, and time.      UC Treatments / Results  Labs (all labs ordered are listed, but only abnormal results are displayed) Labs Reviewed  SARS CORONAVIRUS 2 (TAT 6-24 HRS)    EKG   Radiology No results found.  Procedures Procedures (including critical care time)  Medications Ordered in UC Medications - No data to display  Initial Impression / Assessment and Plan / UC Course  I have reviewed the triage vital signs and the nursing notes.  Pertinent labs & imaging results that were available during my care of the patient were reviewed by me and considered in my medical decision making (see chart for details).     1 week of worsening sinus symptoms and sore throat. augmentin provided today.  covid screening pending as well. Return precautions provided. Patient  verbalized understanding and agreeable to plan.    Final Clinical Impressions(s) / UC Diagnoses   Final diagnoses:  Acute frontal sinusitis, recurrence not specified     Discharge Instructions     Push fluids to  ensure adequate hydration and keep secretions thin.  Tylenol and/or ibuprofen as needed for pain or fevers.  Over the counter medications such as nasal sprays or mucinex as needed to help with symptoms.  Complete course of antibiotics.  If symptoms worsen or do not improve in the next week to return to be seen or to follow up with your PCP.      ED Prescriptions    Medication Sig Dispense Auth. Provider   amoxicillin-clavulanate (AUGMENTIN) 875-125 MG tablet Take 1 tablet by mouth every 12 (twelve) hours for 10 days. 20 tablet Georgetta Haber, NP     PDMP not reviewed this encounter.   Georgetta Haber, NP 09/02/20 [phone]

## 2020-09-13 ENCOUNTER — Ambulatory Visit (INDEPENDENT_AMBULATORY_CARE_PROVIDER_SITE_OTHER): Payer: Medicare Other

## 2020-09-13 DIAGNOSIS — I442 Atrioventricular block, complete: Secondary | ICD-10-CM

## 2020-09-13 LAB — CUP PACEART REMOTE DEVICE CHECK
Battery Remaining Longevity: 68 mo
Battery Voltage: 3 V
Brady Statistic AP VP Percent: 28.8 %
Brady Statistic AP VS Percent: 0.02 %
Brady Statistic AS VP Percent: 71.13 %
Brady Statistic AS VS Percent: 0.04 %
Brady Statistic RA Percent Paced: 28.82 %
Brady Statistic RV Percent Paced: 99.91 %
Date Time Interrogation Session: 20211214091026
Implantable Lead Implant Date: 20171215
Implantable Lead Implant Date: 20171215
Implantable Lead Location: 753859
Implantable Lead Location: 753860
Implantable Lead Model: 5076
Implantable Lead Model: 5076
Implantable Pulse Generator Implant Date: 20171215
Lead Channel Impedance Value: 342 Ohm
Lead Channel Impedance Value: 361 Ohm
Lead Channel Impedance Value: 418 Ohm
Lead Channel Impedance Value: 437 Ohm
Lead Channel Pacing Threshold Amplitude: 0.625 V
Lead Channel Pacing Threshold Amplitude: 0.75 V
Lead Channel Pacing Threshold Pulse Width: 0.4 ms
Lead Channel Pacing Threshold Pulse Width: 0.4 ms
Lead Channel Sensing Intrinsic Amplitude: 1.875 mV
Lead Channel Sensing Intrinsic Amplitude: 1.875 mV
Lead Channel Sensing Intrinsic Amplitude: 7.25 mV
Lead Channel Sensing Intrinsic Amplitude: 7.25 mV
Lead Channel Setting Pacing Amplitude: 2 V
Lead Channel Setting Pacing Amplitude: 2.5 V
Lead Channel Setting Pacing Pulse Width: 0.4 ms
Lead Channel Setting Sensing Sensitivity: 2.8 mV

## 2020-09-27 NOTE — Progress Notes (Signed)
Remote pacemaker transmission.   

## 2020-11-29 ENCOUNTER — Telehealth: Payer: Self-pay | Admitting: Internal Medicine

## 2020-11-29 NOTE — Telephone Encounter (Signed)
Patient sent message to pt scheduling requesting an appt with Dr. Ladona Ridgel, he was ok seeing an APP. Scheduled him for 12/05/20 with Francis Dowse. Just FYI.

## 2020-11-29 NOTE — Telephone Encounter (Signed)
I will forward only as FYI to Francis Dowse, [facility] for appt 12/05/20. This is only an FYI and I am forwarding the message that came in from the operator.

## 2020-11-29 NOTE — Progress Notes (Signed)
Cardiology Office Note Date:  12/05/2020  Patient ID:  Marvin Padilla, Marvin Padilla 1941/04/01, MRN [id number] PCP:  Adrian Prince, MD  Electrophysiologist: Dr. Ladona Ridgel    Chief Complaint:  c/o fatigue, and other concerns  History of Present Illness: Marvin Padilla is a 80 y.o. male with history of CHB w/PPM, DM, HTN, HLD  He comes in today to be seen for Dr.Taylor, last seen by him Aug 2021.  At that time mentioned that he had regained AV conduction though subsequently with some SOB AV delay programed back yo 160/180 with no AV conduction again.  The pat reached out with concerns of fatigue, confusion requesting a visit.  TODAY He comes accompanied by his wife. He reports that he feels like after exercise or heavy exertional activities he is really wiped out He with his son's help is cutting trees in the yard, and after doing this for a while he is really fatigued and sleeps afterwards He denies [facility], or yard work feeling SOB, never has had CP, but afterwards feels really worn out.  Maybe getting more winded then usual, but not markedly so His wife brings a list of concerns Memory seems to be waxing/waning Forgets conversations they just had She has seen him seated in his chair, eyes open, but when she calls to him, he doesn't respond, though then will seem to "snap out of it" and tells her he must have dozed off, but he did not appear to be sleeping. Says he sleeps all of the time.  All of this dating back probably to prior to his visit with dr. Cam Hai to be a bit better after programming changes but did slide back to the same fatigue. They saw his PMD last month and the observations by their recollection chalked up to aging.  He denies any dizzy spells, no near syncope or syncope. No symptoms like thouse her had prior to his pacer implant  Device information MDT dual chamber PPM implanted 09/14/2016   Past Medical History:  Diagnosis Date  .  Generalized arthritis   . High cholesterol   . History of left bundle branch block (LBBB) 05/01/2015  . Hypertension   . Pacemaker    a. 09/14/16: CHB s/p PPM: Medtronic (serial number [id number]) pacemaker  . 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 BACK SURGERY    . EP IMPLANTABLE DEVICE N/A 09/14/2016   Procedure: Pacemaker Implant;  Surgeon: Marinus Maw, MD;  Location: [facility] INVASIVE CV LAB;  Service: Cardiovascular;  Laterality: N/A;  . NOSE SURGERY    . PACEMAKER IMPLANT  09/14/2016    Current Outpatient Medications  Medication Sig Dispense Refill  . ACCU-CHEK GUIDE test strip     . Blood Glucose Calibration (ACCU-CHEK GUIDE CONTROL) LIQD     . Clotrimazole (LOTRIMIN AF EX) Apply topically as needed.    . diphenhydramine-acetaminophen (TYLENOL PM) 25-500 MG TABS tablet Take 1 tablet by mouth at bedtime.    . docusate sodium (COLACE) 100 MG capsule Take 100 mg by mouth daily.    Marland Kitchen glimepiride (AMARYL) 2 MG tablet Take 2 mg by mouth daily with breakfast.    . lisinopril-hydrochlorothiazide (PRINZIDE,ZESTORETIC) 20-25 MG tablet Take 1 tablet by mouth daily.    . metFORMIN (GLUCOPHAGE) 500 MG tablet Take 500 mg by mouth 2 (two) times daily with a meal.    . Multiple  Vitamins-Minerals (MENS MULTIVITAMIN) TABS Take 1 tablet by mouth daily.    . simvastatin (ZOCOR) 40 MG tablet Take 40 mg by mouth daily.  4  . terbinafine (LAMISIL) 250 MG tablet      No current facility-administered medications for this visit.    Allergies:   Patient has no known allergies.   Social History:  The patient  reports that he has never smoked. He has never used smokeless tobacco. He reports that he does not drink alcohol and does not use drugs.   Family History:  The patient's family history includes Cancer in his sister; Heart failure in his father and mother; Lung cancer in his maternal grandfather.  ROS:  Please  see the history of present illness.    All other systems are reviewed and otherwise negative.   PHYSICAL EXAM:  VS:  BP (!) 170/70   Pulse 68   Ht 5\' 10"  (1.778 m)   Wt 214 lb 3.2 oz (97.2 kg)   SpO2 98%   BMI 30.73 kg/m  BMI: Body mass index is 30.73 kg/m. Well nourished, well developed, in no acute distress HEENT: normocephalic, atraumatic Neck: no JVD, carotid bruits or masses Cardiac:  RRR; no significant murmurs, no rubs, or gallops Lungs:  CTA b/l, no wheezing, rhonchi or rales Abd: soft, nontender MS: no deformity , age appropriate atrophy Ext: no edema Skin: warm and dry, no rash Neuro:  No gross deficits appreciated Psych: euthymic mood, full affect  PPM site is stable, no tethering or discomfort   EKG:  Done today and reviewed by myself shows  SR, V paced  Device interrogation done today and reviewed by myself:  Battery and lead measurements are good/stable He has AV  Conduction today No arrhythmias VP 99.9% AP 27.8%   05/02/2015: TTE Study Conclusions  - Left ventricle: The cavity size was normal. Wall thickness was  increased in a pattern of mild LVH. Systolic function was normal.  The estimated ejection fraction was in the range of 60% to 65%.  Doppler parameters are consistent with abnormal left ventricular  relaxation (grade 1 diastolic dysfunction).    Recent Labs: No results found for requested labs within last 8760 hours.  No results found for requested labs within last 8760 hours.   CrCl cannot be calculated (Patient's most recent lab result is older than the maximum 21 days allowed.).   Wt Readings from Last 3 Encounters:  12/05/20 214 lb 3.2 oz (97.2 kg)  11/18/19 209 lb (94.8 kg)  11/11/18 211 lb (95.7 kg)     Other studies reviewed: Additional studies/records reviewed today include: summarized above  ASSESSMENT AND PLAN:  1. PPM     Intact function, no programming changes made     HR 60's-80's by historgrams  2. HTN      High today, recheck is 160/80, they reort his BP last month at his PMD much better     Asked to monitor his BP at home  3. Fatigue, symptoms as discussed above     Not clearly cardiac, though he VP all of the time     Will check echo assess LV function to start     I have also asked them to check his BS when he has the "zoned out" moments    C/w his PMD as well   Disposition: F/u with echo findings and 49mo, sooner if needed  Current medicines are reviewed at length with the patient today.  The patient did not have  any concerns regarding medicines.  Norma Fredrickson, PA-C 12/05/2020 10:23 AM     [facility] [address] [place] [address] [phone] (office)  [phone] (fax)

## 2020-12-05 ENCOUNTER — Other Ambulatory Visit: Payer: Self-pay

## 2020-12-05 ENCOUNTER — Encounter: Payer: Self-pay | Admitting: Physician Assistant

## 2020-12-05 ENCOUNTER — Ambulatory Visit: Payer: Medicare Other | Admitting: Physician Assistant

## 2020-12-05 VITALS — BP 170/70 | HR 68 | Ht 70.0 in | Wt 214.2 lb

## 2020-12-05 DIAGNOSIS — Z950 Presence of cardiac pacemaker: Secondary | ICD-10-CM | POA: Diagnosis not present

## 2020-12-05 DIAGNOSIS — I442 Atrioventricular block, complete: Secondary | ICD-10-CM | POA: Diagnosis not present

## 2020-12-05 DIAGNOSIS — I429 Cardiomyopathy, unspecified: Secondary | ICD-10-CM

## 2020-12-05 DIAGNOSIS — R5383 Other fatigue: Secondary | ICD-10-CM

## 2020-12-05 DIAGNOSIS — R0602 Shortness of breath: Secondary | ICD-10-CM | POA: Diagnosis not present

## 2020-12-05 LAB — CUP PACEART INCLINIC DEVICE CHECK
Battery Remaining Longevity: 68 mo
Battery Voltage: 3 V
Brady Statistic AP VP Percent: 27.72 %
Brady Statistic AP VS Percent: 0.02 %
Brady Statistic AS VP Percent: 72.2 %
Brady Statistic AS VS Percent: 0.06 %
Brady Statistic RA Percent Paced: 27.74 %
Brady Statistic RV Percent Paced: 99.9 %
Date Time Interrogation Session: 20220307103844
Implantable Lead Implant Date: 20171215
Implantable Lead Implant Date: 20171215
Implantable Lead Location: 753859
Implantable Lead Location: 753860
Implantable Lead Model: 5076
Implantable Lead Model: 5076
Implantable Pulse Generator Implant Date: 20171215
Lead Channel Impedance Value: 361 Ohm
Lead Channel Impedance Value: 418 Ohm
Lead Channel Impedance Value: 475 Ohm
Lead Channel Impedance Value: 475 Ohm
Lead Channel Pacing Threshold Amplitude: 0.625 V
Lead Channel Pacing Threshold Amplitude: 0.625 V
Lead Channel Pacing Threshold Pulse Width: 0.4 ms
Lead Channel Pacing Threshold Pulse Width: 0.4 ms
Lead Channel Sensing Intrinsic Amplitude: 11.625 mV
Lead Channel Sensing Intrinsic Amplitude: 2 mV
Lead Channel Sensing Intrinsic Amplitude: 3.5 mV
Lead Channel Sensing Intrinsic Amplitude: 6.875 mV
Lead Channel Setting Pacing Amplitude: 2 V
Lead Channel Setting Pacing Amplitude: 2.5 V
Lead Channel Setting Pacing Pulse Width: 0.4 ms
Lead Channel Setting Sensing Sensitivity: 2.8 mV

## 2020-12-05 NOTE — Patient Instructions (Signed)
Medication Instructions:   Your physician recommends that you continue on your current medications as directed. Please refer to the Current Medication list given to you today.   *If you need a refill on your cardiac medications before your next appointment, please call your pharmacy*   Lab Work: NONE ORDERED  TODAY   If you have labs (blood work) drawn today and your tests are completely normal, you will receive your results only by: Marland Kitchen MyChart Message (if you have MyChart) OR . A paper copy in the mail If you have any lab test that is abnormal or we need to change your treatment, we will call you to review the results.   Testing/Procedures: Your physician has requested that you have an echocardiogram. Echocardiography is a painless test that uses sound waves to create images of your heart. It provides your doctor with information about the size and shape of your heart and how well your heart's chambers and valves are working. This procedure takes approximately one hour. There are no restrictions for this procedure.   Follow-Up: At [facility], you and your health needs are our priority.  As part of our continuing mission to provide you with exceptional heart care, we have created designated Provider Care Teams.  These Care Teams include your primary Cardiologist (physician) and Advanced Practice Providers (APPs -  Physician Assistants and Nurse Practitioners) who all work together to provide you with the care you need, when you need it.  We recommend signing up for the patient portal called "MyChart".  Sign up information is provided on this After Visit Summary.  MyChart is used to connect with patients for Virtual Visits (Telemedicine).  Patients are able to view lab/test results, encounter notes, upcoming appointments, etc.  Non-urgent messages can be sent to your provider as well.   To learn more about what you can do with MyChart, go to [email or web address].    Your next  appointment:   2 month(s)  The format for your next appointment:   In Person  Provider:   Francis Dowse, PA-C   Other Instructions

## 2020-12-13 ENCOUNTER — Ambulatory Visit (INDEPENDENT_AMBULATORY_CARE_PROVIDER_SITE_OTHER): Payer: Medicare Other

## 2020-12-13 DIAGNOSIS — I442 Atrioventricular block, complete: Secondary | ICD-10-CM | POA: Diagnosis not present

## 2020-12-13 LAB — CUP PACEART REMOTE DEVICE CHECK
Battery Remaining Longevity: 68 mo
Battery Voltage: 3 V
Brady Statistic AP VP Percent: 30.42 %
Brady Statistic AP VS Percent: 0.04 %
Brady Statistic AS VP Percent: 69.47 %
Brady Statistic AS VS Percent: 0.07 %
Brady Statistic RA Percent Paced: 30.45 %
Brady Statistic RV Percent Paced: 99.87 %
Date Time Interrogation Session: 20220315064720
Implantable Lead Implant Date: 20171215
Implantable Lead Implant Date: 20171215
Implantable Lead Location: 753859
Implantable Lead Location: 753860
Implantable Lead Model: 5076
Implantable Lead Model: 5076
Implantable Pulse Generator Implant Date: 20171215
Lead Channel Impedance Value: 342 Ohm
Lead Channel Impedance Value: 361 Ohm
Lead Channel Impedance Value: 437 Ohm
Lead Channel Impedance Value: 456 Ohm
Lead Channel Pacing Threshold Amplitude: 0.625 V
Lead Channel Pacing Threshold Amplitude: 0.75 V
Lead Channel Pacing Threshold Pulse Width: 0.4 ms
Lead Channel Pacing Threshold Pulse Width: 0.4 ms
Lead Channel Sensing Intrinsic Amplitude: 13.125 mV
Lead Channel Sensing Intrinsic Amplitude: 13.125 mV
Lead Channel Sensing Intrinsic Amplitude: 2.25 mV
Lead Channel Sensing Intrinsic Amplitude: 2.25 mV
Lead Channel Setting Pacing Amplitude: 2 V
Lead Channel Setting Pacing Amplitude: 2.5 V
Lead Channel Setting Pacing Pulse Width: 0.4 ms
Lead Channel Setting Sensing Sensitivity: 2.8 mV

## 2020-12-21 NOTE — Progress Notes (Signed)
Remote pacemaker transmission.   

## 2020-12-27 ENCOUNTER — Ambulatory Visit (HOSPITAL_COMMUNITY): Payer: Medicare Other | Attending: Internal Medicine

## 2020-12-27 ENCOUNTER — Other Ambulatory Visit: Payer: Self-pay

## 2020-12-27 DIAGNOSIS — R0602 Shortness of breath: Secondary | ICD-10-CM | POA: Diagnosis not present

## 2020-12-27 DIAGNOSIS — I429 Cardiomyopathy, unspecified: Secondary | ICD-10-CM | POA: Insufficient documentation

## 2020-12-27 LAB — ECHOCARDIOGRAM COMPLETE
Area-P 1/2: 2.6 cm2
S' Lateral: 3.4 cm

## 2021-02-01 NOTE — Progress Notes (Signed)
Cardiology Office Note Date:  02/01/2021  Patient ID:  Marvin Padilla, Marvin Padilla [birth year], MRN [id number] PCP:  Adrian Prince, MD  Electrophysiologist: Dr. Ladona Ridgel    Chief Complaint:  planned f/u  History of Present Illness: Marvin Padilla is a 80 y.o. male with history of CHB w/PPM, DM, HTN, HLD  He comes in today to be seen for Dr.Taylor, last seen by him Aug 2021.  At that time mentioned that he had regained AV conduction though subsequently with some SOB AV delay programed back yo 160/180 with no AV conduction again.  The patient reached out with concerns of fatigue, confusion requesting a visit.  I saw him 12/05/20 He comes accompanied by his wife. He reports that he feels like after exercise or heavy exertional activities he is really wiped out He with his son's help is cutting trees in the yard, and after doing this for a while he is really fatigued and sleeps afterwards He denies DURING [facility] [employer], or yard work feeling SOB, never has had CP, but afterwards feels really worn out.  Maybe getting more winded then usual, but not markedly so His wife brings a list of concerns Memory seems to be waxing/waning Forgets conversations they just had She has seen him seated in his chair, eyes open, but when she calls to him, he doesn't respond, though then will seem to "snap out of it" and tells her he must have dozed off, but he did not appear to be sleeping. Says he sleeps all of the time. All of this dating back probably to prior to his visit with Dr. Ladona Ridgel, seemed to be a bit better after programming changes but did slide back to the same fatigue. They saw his PMD last month and the observations by their recollection chalked up to aging. He denies any dizzy spells, no near syncope or syncope. No symptoms like those he had prior to his pacer implant  He was noted at that visit to have AV conduction, no programming changes were made (noting previously felt worse with prolong  AV delay to allow conduction and programmed back to 160/180 Planned to update his echo given RV pacing F/u with PMD  Echo noted LVEF 54%, grade I DD, RV OK, no VHD of any significance, AO 44mm Recommended to f/u with his PMD for further w/u of his symptoms/wife's observations   TODAY He is doing well Continues to be very active and with no exertional intolerances, though otherwise feels like he has had a slow/general slow down His memory remsins intermittenet and occasionally forgetful They did discuss this with his PMD last visit and felt to have been c/w aging process They see him again in June  No sym,ptoms of orthopnea or PND No SOB, DOE No CP, palpitations or cardiaca awareness No dizzy spells, near syncope or syncope  Device information MDT dual chamber PPM implanted 09/14/2016   Past Medical History:  Diagnosis Date  . Generalized arthritis   . High cholesterol   . History of left bundle branch block (LBBB) 05/01/2015  . Hypertension   . Pacemaker    a. 09/14/16: CHB s/p PPM: Medtronic (serial number [id number]) pacemaker  . 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 BACK SURGERY    . EP IMPLANTABLE DEVICE N/A 09/14/2016   Procedure: Pacemaker Implant;  Surgeon: Marinus Maw, MD;  Location: [facility] INVASIVE CV  LAB;  Service: Cardiovascular;  Laterality: N/A;  . NOSE SURGERY    . PACEMAKER IMPLANT  09/14/2016    Current Outpatient Medications  Medication Sig Dispense Refill  . ACCU-CHEK GUIDE test strip     . Blood Glucose Calibration (ACCU-CHEK GUIDE CONTROL) LIQD     . Clotrimazole (LOTRIMIN AF EX) Apply topically as needed.    . diphenhydramine-acetaminophen (TYLENOL PM) 25-500 MG TABS tablet Take 1 tablet by mouth at bedtime.    . docusate sodium (COLACE) 100 MG capsule Take 100 mg by mouth daily.    Marland Kitchen glimepiride (AMARYL) 2 MG tablet Take 2 mg by mouth daily with breakfast.     . lisinopril-hydrochlorothiazide (PRINZIDE,ZESTORETIC) 20-25 MG tablet Take 1 tablet by mouth daily.    . metFORMIN (GLUCOPHAGE) 500 MG tablet Take 500 mg by mouth 2 (two) times daily with a meal.    . Multiple Vitamins-Minerals (MENS MULTIVITAMIN) TABS Take 1 tablet by mouth daily.    . simvastatin (ZOCOR) 40 MG tablet Take 40 mg by mouth daily.  4  . terbinafine (LAMISIL) 250 MG tablet      No current facility-administered medications for this visit.    Allergies:   Patient has no known allergies.   Social History:  The patient  reports that he has never smoked. He has never used smokeless tobacco. He reports that he does not drink alcohol and does not use drugs.   Family History:  The patient's family history includes Cancer in his sister; Heart failure in his father and mother; Lung cancer in his maternal grandfather.  ROS:  Please see the history of present illness.    All other systems are reviewed and otherwise negative.   PHYSICAL EXAM:  VS:  There were no vitals taken for this visit. BMI: There is no height or weight on file to calculate BMI. Well nourished, well developed, in no acute distress HEENT: normocephalic, atraumatic Neck: no JVD, carotid bruits or masses Cardiac:  RRR; no significant murmurs, no rubs, or gallops Lungs:  CTA b/l, no wheezing, rhonchi or rales Abd: soft, nontender MS: no deformity , age appropriate atrophy Ext: no edema Skin: warm and dry, no rash Neuro:  No gross deficits appreciated Psych: euthymic mood, full affect  PPM site is stable, no tethering or discomfort   EKG:  Not done today  Device interrogation done today and reviewed by myself:  Battery and lead measurements are good/stable He has AV conduction today No arrhythmias VP 99.9% AP 30%   12/27/2020: TTE IMPRESSIONS  1. Left ventricular ejection fraction by 3D volume is 54 %. The left  ventricle has normal function. The left ventricle demonstrates regional  wall motion  abnormalities (see scoring diagram/findings for description).  There is mild left ventricular  hypertrophy. Left ventricular diastolic parameters are consistent with  Grade I diastolic dysfunction (impaired relaxation).  2. Right ventricular systolic function is normal. The right ventricular  size is normal. There is normal pulmonary artery systolic pressure.  3. Chordal systolic anterior motion. The mitral valve is normal in  structure. Trivial mitral valve regurgitation. No evidence of mitral  stenosis.  4. The aortic valve is grossly normal. There is mild thickening of the  aortic valve. Aortic valve regurgitation is trivial. No aortic stenosis is  present.  5. The inferior vena cava is normal in size with greater than 50%  respiratory variability, suggesting right atrial pressure of 3 mmHg.  6. Aortic dilatation noted. There is mild dilatation of the ascending  aorta, measuring 41 mm.    05/02/2015: TTE Study Conclusions  - Left ventricle: The cavity size was normal. Wall thickness was  increased in a pattern of mild LVH. Systolic function was normal.  The estimated ejection fraction was in the range of 60% to 65%.  Doppler parameters are consistent with abnormal left ventricular  relaxation (grade 1 diastolic dysfunction).    Recent Labs: No results found for requested labs within last 8760 hours.  No results found for requested labs within last 8760 hours.   CrCl cannot be calculated (Patient's most recent lab result is older than the maximum 21 days allowed.).   Wt Readings from Last 3 Encounters:  12/05/20 214 lb 3.2 oz (97.2 kg)  11/18/19 209 lb (94.8 kg)  11/11/18 211 lb (95.7 kg)     Other studies reviewed: Additional studies/records reviewed today include: summarized above  ASSESSMENT AND PLAN:  1. PPM     Intact function, no programming changes made       2. HTN     Looks OK, no changes  3. Fatigue, symptoms as discussed above     They will  follow up with his PMD   Disposition: remotes as usual and in clinic with EP in 1 year, sooner if needed  Current medicines are reviewed at length with the patient today.  The patient did not have any concerns regarding medicines.  Norma Fredrickson, PA-C 02/01/2021 6:00 AM     [facility] [address] [place] [address] [phone] (office)  [phone] (fax)

## 2021-02-02 ENCOUNTER — Encounter: Payer: Self-pay | Admitting: Physician Assistant

## 2021-02-02 ENCOUNTER — Ambulatory Visit: Payer: Medicare Other | Admitting: Physician Assistant

## 2021-02-02 ENCOUNTER — Other Ambulatory Visit: Payer: Self-pay

## 2021-02-02 VITALS — BP 130/62 | HR 68 | Ht 70.0 in | Wt 213.8 lb

## 2021-02-02 DIAGNOSIS — R5383 Other fatigue: Secondary | ICD-10-CM

## 2021-02-02 DIAGNOSIS — I442 Atrioventricular block, complete: Secondary | ICD-10-CM

## 2021-02-02 DIAGNOSIS — I10 Essential (primary) hypertension: Secondary | ICD-10-CM

## 2021-02-02 DIAGNOSIS — Z950 Presence of cardiac pacemaker: Secondary | ICD-10-CM

## 2021-02-02 LAB — CUP PACEART INCLINIC DEVICE CHECK
Battery Remaining Longevity: 62 mo
Battery Voltage: 3 V
Brady Statistic AP VP Percent: 30.48 %
Brady Statistic AP VS Percent: 0.03 %
Brady Statistic AS VP Percent: 69.42 %
Brady Statistic AS VS Percent: 0.06 %
Brady Statistic RA Percent Paced: 30.51 %
Brady Statistic RV Percent Paced: 99.88 %
Date Time Interrogation Session: 20220505165409
Implantable Lead Implant Date: 20171215
Implantable Lead Implant Date: 20171215
Implantable Lead Location: 753859
Implantable Lead Location: 753860
Implantable Lead Model: 5076
Implantable Lead Model: 5076
Implantable Pulse Generator Implant Date: 20171215
Lead Channel Impedance Value: 380 Ohm
Lead Channel Impedance Value: 399 Ohm
Lead Channel Impedance Value: 456 Ohm
Lead Channel Impedance Value: 494 Ohm
Lead Channel Pacing Threshold Amplitude: 0.625 V
Lead Channel Pacing Threshold Amplitude: 0.625 V
Lead Channel Pacing Threshold Pulse Width: 0.4 ms
Lead Channel Pacing Threshold Pulse Width: 0.4 ms
Lead Channel Sensing Intrinsic Amplitude: 2.75 mV
Lead Channel Sensing Intrinsic Amplitude: 3 mV
Lead Channel Sensing Intrinsic Amplitude: 6.625 mV
Lead Channel Sensing Intrinsic Amplitude: 7.25 mV
Lead Channel Setting Pacing Amplitude: 2 V
Lead Channel Setting Pacing Amplitude: 2.5 V
Lead Channel Setting Pacing Pulse Width: 0.4 ms
Lead Channel Setting Sensing Sensitivity: 2.8 mV

## 2021-02-02 NOTE — Patient Instructions (Addendum)
Medication Instructions:   Your physician recommends that you continue on your current medications as directed. Please refer to the Current Medication list given to you today. *If you need a refill on your cardiac medications before your next appointment, please call your pharmacy*   Lab Work: NONE ORDERED  TODAY    If you have labs (blood work) drawn today and your tests are completely normal, you will receive your results only by: Marland Kitchen MyChart Message (if you have MyChart) OR . A paper copy in the mail If you have any lab test that is abnormal or we need to change your treatment, we will call you to review the results.   Testing/Procedures: NONE ORDERED  TODAY    Follow-Up: At [facility], you and your health needs are our priority.  As part of our continuing mission to provide you with exceptional heart care, we have created designated Provider Care Teams.  These Care Teams include your primary Cardiologist (physician) and Advanced Practice Providers (APPs -  Physician Assistants and Nurse Practitioners) who all work together to provide you with the care you need, when you need it.  We recommend signing up for the patient portal called "MyChart".  Sign up information is provided on this After Visit Summary.  MyChart is used to connect with patients for Virtual Visits (Telemedicine).  Patients are able to view lab/test results, encounter notes, upcoming appointments, etc.  Non-urgent messages can be sent to your provider as well.   To learn more about what you can do with MyChart, go to [email or web address].    Your next appointment:   6 Months   The format for your next appointment:   In Person  Provider:   You may see Dr. Ladona Ridgel  or one of the following Advanced Practice Providers on your designated Care Team:    Gypsy Balsam, NP  Francis Dowse, PA-C  Casimiro Needle "Otilio Saber, [place]    Other Instructions

## 2021-02-15 ENCOUNTER — Other Ambulatory Visit: Payer: Self-pay

## 2021-02-15 ENCOUNTER — Ambulatory Visit (HOSPITAL_COMMUNITY)
Admission: EM | Admit: 2021-02-15 | Discharge: 2021-02-15 | Disposition: A | Discharge status: Home / Self Care | Payer: Medicare Other | Attending: Family Medicine | Admitting: Family Medicine

## 2021-02-15 ENCOUNTER — Encounter (HOSPITAL_COMMUNITY): Payer: Self-pay

## 2021-02-15 DIAGNOSIS — L2389 Allergic contact dermatitis due to other agents: Secondary | ICD-10-CM

## 2021-02-15 MED ORDER — TRIAMCINOLONE ACETONIDE 0.1 % EX CREA: 1.0000 "application " | TOPICAL_CREAM | Freq: Two times a day (BID) | CUTANEOUS | 0 refills | Status: DC

## 2021-02-15 NOTE — ED Provider Notes (Signed)
[facility]   [id number] 02/15/21 Arrival Time: 3825  ASSESSMENT & PLAN:  1. Allergic contact dermatitis due to other agents    No signs of bacterial skin infection. Suspect allergic rxn to Neosporin. Has stopped using.  Begin: Meds ordered this encounter  Medications  . triamcinolone cream (KENALOG) 0.1 %    Sig: Apply 1 application topically 2 (two) times daily.    Dispense:  15 g    Refill:  0    Follow-up Information    Adrian Prince, MD.   Specialty: Endocrinology Why: As needed. Contact information: [address] [place] [address] [phone]        [facility]   Specialty: Urgent Care Why: If worsening or failing to improve as anticipated. Contact information: [address] [place] [address] [phone]                Will follow up with PCP or here if worsening or failing to improve as anticipated. Reviewed expectations re: course of current medical issues. Questions answered. Outlined signs and symptoms indicating need for more acute intervention. Patient verbalized understanding. After Visit Summary given.   SUBJECTIVE:  Marvin Padilla is a 80 y.o. male who presents with a skin complaint. Inner L forearm; questions insect bite. Redness present for one week; has been using band-aid with neosporin regularly. Mild itching and tingling. Afebrile. No bleeding or drainage.  OBJECTIVE: Vitals:   02/15/21 1015  BP: (!) 164/83  Pulse: 65  Resp: 19  Temp: 97.7 F (36.5 C)  SpO2: 99%    General appearance: alert; no distress HEENT: [place]; AT Neck: supple with FROM Extremities: no edema; moves all extremities normally Skin: warm and dry; area of skin erythema consistent with a contact dermatitis over R inner forearm; no bleeding or drainage Psychological: alert and cooperative; normal mood and affect  No Known Allergies  Past Medical History:  Diagnosis Date  . Generalized  arthritis   . High cholesterol   . History of left bundle branch block (LBBB) 05/01/2015  . Hypertension   . Pacemaker    a. 09/14/16: CHB s/p PPM: Medtronic (serial number [id number]) pacemaker  . 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 Marital status: Married    Spouse name: Not on file  . Number of children: 2  . Years of education: Not on file  . Highest education level: Associate degree: academic program  Occupational History  . Not on file  Tobacco Use  . Smoking status: Never Smoker  . Smokeless tobacco: Never Used  Vaping Use  . Vaping Use: Never used  Substance and Sexual Activity  . Alcohol use: No  . Drug use: No  . Sexual activity: Not on file  Other Topics Concern  . Not on file  Social History Narrative   Lives at home with wife    Right handed   Caffeine: 1/2 and 1/2 caffeine 2-3 cups in morning, maybe additional cup of regular in the afternoon   Social Determinants of Health   Financial Resource Strain: Not on file  Food Insecurity: Not on file  Transportation Needs: Not on file  Physical Activity: Not on file  Stress: Not on file  Social Connections: Not on file  Intimate Partner Violence: Not on file   Family History  Problem Relation Age of Onset  . Heart failure Mother   .  Heart failure Father   . Lung cancer Maternal Grandfather   . Cancer Sister    Past Surgical History:  Procedure Laterality Date  . BACK SURGERY    . EP IMPLANTABLE DEVICE N/A 09/14/2016   Procedure: Pacemaker Implant;  Surgeon: Marinus Maw, MD;  Location: [facility] INVASIVE CV LAB;  Service: Cardiovascular;  Laterality: N/A;  . NOSE SURGERY    . PACEMAKER IMPLANT  09/14/2016     Mardella Layman, MD 02/15/21 1047

## 2021-02-15 NOTE — ED Triage Notes (Signed)
Pt in with c/o right arm pain, redness, and swelling x 1 week  Pt states he think he may have been bitten but the pain is increasing and the redness is spreading

## 2021-02-22 ENCOUNTER — Ambulatory Visit (HOSPITAL_COMMUNITY)
Admission: EM | Admit: 2021-02-22 | Discharge: 2021-02-22 | Disposition: A | Discharge status: Home / Self Care | Payer: Medicare Other | Attending: Emergency Medicine | Admitting: Emergency Medicine

## 2021-02-22 ENCOUNTER — Other Ambulatory Visit: Payer: Self-pay

## 2021-02-22 ENCOUNTER — Encounter (HOSPITAL_COMMUNITY): Payer: Self-pay | Admitting: Emergency Medicine

## 2021-02-22 DIAGNOSIS — T148XXA Other injury of unspecified body region, initial encounter: Secondary | ICD-10-CM

## 2021-02-22 DIAGNOSIS — S91132A Puncture wound without foreign body of left great toe without damage to nail, initial encounter: Secondary | ICD-10-CM | POA: Diagnosis not present

## 2021-02-22 MED ORDER — CEPHALEXIN 500 MG PO CAPS: 500.0000 mg | ORAL_CAPSULE | Freq: Four times a day (QID) | ORAL | 0 refills | Status: DC

## 2021-02-22 NOTE — Discharge Instructions (Addendum)
Discussed that pt would need to follow up with podiatry or pcp in the next few days to ensure no complications  Pt understands that it would take longer to heal due to DM  Wash dry area and keep covered  Take full dose of medications with food

## 2021-02-22 NOTE — ED Provider Notes (Signed)
[facility]    CSN: [id number] Arrival date & time: 02/22/21  1039      History   Chief Complaint Chief Complaint  Patient presents with  . Foot Injury    HPI Marvin Padilla is a 80 y.o. male.   Pt is a DM and went fishing cut his big lt toe under side. He has some swelling and pain. He was concerned due to DM can have delay of healing. Pt has been soaking and cleaning area. Denies any fevers. Sugar was 119 this am/.      Past Medical History:  Diagnosis Date  . Generalized arthritis   . High cholesterol   . History of left bundle branch block (LBBB) 05/01/2015  . Hypertension   . Pacemaker    a. 09/14/16: CHB s/p PPM: Medtronic (serial number [id number]) pacemaker  . 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 Pacemaker   . 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 LBBB (left bundle branch block) 05/01/2015  . Essential hypertension   . Bradycardia 04/30/2015  . High cholesterol   . TBI (traumatic brain injury) [facility])     Past Surgical History:  Procedure Laterality Date  . BACK SURGERY    . EP IMPLANTABLE DEVICE N/A 09/14/2016   Procedure: Pacemaker Implant;  Surgeon: Marinus Maw, MD;  Location: [facility] INVASIVE CV LAB;  Service: Cardiovascular;  Laterality: N/A;  . NOSE SURGERY    . PACEMAKER IMPLANT  09/14/2016       Home Medications    Prior to Admission medications   Medication Sig Start Date End Date Taking? Authorizing Provider  cephALEXin (KEFLEX) 500 MG capsule Take 1 capsule (500 mg total) by mouth 4 (four) times daily. 02/22/21  Yes Coralyn Mark, NP  ACCU-CHEK GUIDE test strip  06/22/19   [provider]  amLODipine (NORVASC) 5 MG tablet Take 1 tablet by mouth daily. 01/19/21   [provider]  Blood Glucose Calibration (ACCU-CHEK GUIDE CONTROL) LIQD  06/22/19   [provider]   Clotrimazole (LOTRIMIN AF EX) Apply topically as needed.    [provider]  diphenhydramine-acetaminophen (TYLENOL PM) 25-500 MG TABS tablet Take 1 tablet by mouth at bedtime.    [provider]  docusate sodium (COLACE) 100 MG capsule Take 100 mg by mouth daily.    [provider]  finasteride (PROSCAR) 5 MG tablet Take 5 mg by mouth daily. 01/16/21   [provider]  glimepiride (AMARYL) 2 MG tablet Take 2 mg by mouth daily with breakfast.    [provider]  lisinopril-hydrochlorothiazide (PRINZIDE,ZESTORETIC) 20-25 MG tablet Take 1 tablet by mouth daily. 09/07/16   [provider]  meloxicam (MOBIC) 7.5 MG tablet Take 7.5 mg by mouth daily. 12/26/20   [provider]  metFORMIN (GLUCOPHAGE) 500 MG tablet Take 500 mg by mouth 2 (two) times daily with a meal.    [provider]  Multiple Vitamins-Minerals (MENS MULTIVITAMIN) TABS Take 1 tablet by mouth daily. 11/03/19   [provider]  simvastatin (ZOCOR) 40 MG tablet Take 40 mg by mouth daily. 04/02/15   [provider]  tamsulosin (FLOMAX) 0.4 MG CAPS capsule Take 0.4 mg by mouth daily. 01/16/21   [provider]  terbinafine (LAMISIL) 250 MG tablet Take 250 mg by mouth daily. 06/29/19   [provider]  triamcinolone cream (KENALOG) 0.1 % Apply 1 application topically 2 (two) times daily. 02/15/21   Mardella Layman, MD    Family History Family History  Problem Relation Age of Onset  . Heart failure Mother   . Heart failure Father   . Lung cancer Maternal Grandfather   . Cancer Sister     Social History Social History   Tobacco Use  . Smoking status: Never Smoker  . Smokeless tobacco: Never Used  Vaping Use  . Vaping Use: Never used  Substance Use Topics  . Alcohol use: No  . Drug use: No     Allergies   Patient has no known allergies.   Review of Systems Review of Systems  Constitutional: Negative.   Respiratory:  Negative.   Cardiovascular: Negative.   Skin: Positive for wound.       Under lt great toe   Neurological: Negative.      Physical Exam Triage Vital Signs ED Triage Vitals  Enc Vitals Group     BP 02/22/21 1115 (!) 172/83     Pulse Rate 02/22/21 1115 65     Resp 02/22/21 1115 17     Temp 02/22/21 1115 97.7 F (36.5 C)     Temp Source 02/22/21 1115 Oral     SpO2 02/22/21 1115 98 %     Weight --      Height --      Head Circumference --      Peak Flow --      Pain Score 02/22/21 1113 6     Pain Loc --      Pain Edu? --      Excl. in GC? --    No data found.  Updated Vital Signs BP (!) 172/83   Pulse 65   Temp 97.7 F (36.5 C) (Oral)   Resp 17   SpO2 98%   Visual Acuity Right Eye Distance:   Left Eye Distance:   Bilateral Distance:    Right Eye Near:   Left Eye Near:    Bilateral Near:     Physical Exam Constitutional:      Appearance: Normal appearance.  Cardiovascular:     Rate and Rhythm: Normal rate.     Pulses: Normal pulses.  Pulmonary:     Effort: Pulmonary effort is normal.  Skin:    Capillary Refill: Capillary refill takes less than 2 seconds.     Comments: Posterior lt great toe has a small 2 cm opening skin tear through first layer of skin. Slight erythema and mild edema. Clean/ pink  to surrounding tissues and no drainage.   Neurological:     Mental Status: He is alert.      UC Treatments / Results  Labs (all labs ordered are listed, but only abnormal results are displayed) Labs Reviewed - No data to display  EKG   Radiology No results found.  Procedures Procedures (including critical care time)  Medications Ordered in UC Medications - No data to display  Initial Impression / Assessment and Plan / UC Course  I have reviewed the triage vital signs and the nursing notes.  Pertinent labs & imaging results that were available during my care of the patient were reviewed by me and considered in my medical decision making (see  chart for details).    Discussed that pt would need to follow up with podiatry or pcp in the next few days to ensure no complications  Pt understands that it  would take longer to heal due to DM  Wash dry area and keep covered  Take full dose of medications with food   Final Clinical Impressions(s) / UC Diagnoses   Final diagnoses:  Puncture wound     Discharge Instructions     Discussed that pt would need to follow up with podiatry or pcp in the next few days to ensure no complications  Pt understands that it would take longer to heal due to DM  Wash dry area and keep covered  Take full dose of medications with food     ED Prescriptions    Medication Sig Dispense Auth. Provider   cephALEXin (KEFLEX) 500 MG capsule Take 1 capsule (500 mg total) by mouth 4 (four) times daily. 20 capsule Coralyn Mark, NP     PDMP not reviewed this encounter.   Coralyn Mark, NP 02/22/21 1149

## 2021-02-22 NOTE — ED Triage Notes (Addendum)
Pt Is present today with a left toe injury. Pt states that he was walking on the sand at the beach last week and noticed pain under his big toe. Pt states that his grandson noticed that he had a cut under his left big toe

## 2021-03-14 ENCOUNTER — Ambulatory Visit (INDEPENDENT_AMBULATORY_CARE_PROVIDER_SITE_OTHER): Payer: Medicare Other

## 2021-03-14 DIAGNOSIS — I442 Atrioventricular block, complete: Secondary | ICD-10-CM

## 2021-03-14 LAB — CUP PACEART REMOTE DEVICE CHECK
Battery Remaining Longevity: 59 mo
Battery Voltage: 3 V
Brady Statistic AP VP Percent: 31.52 %
Brady Statistic AP VS Percent: 0.04 %
Brady Statistic AS VP Percent: 68.39 %
Brady Statistic AS VS Percent: 0.06 %
Brady Statistic RA Percent Paced: 31.55 %
Brady Statistic RV Percent Paced: 99.88 %
Date Time Interrogation Session: 20220614092114
Implantable Lead Implant Date: 20171215
Implantable Lead Implant Date: 20171215
Implantable Lead Location: 753859
Implantable Lead Location: 753860
Implantable Lead Model: 5076
Implantable Lead Model: 5076
Implantable Pulse Generator Implant Date: 20171215
Lead Channel Impedance Value: 342 Ohm
Lead Channel Impedance Value: 361 Ohm
Lead Channel Impedance Value: 418 Ohm
Lead Channel Impedance Value: 456 Ohm
Lead Channel Pacing Threshold Amplitude: 0.625 V
Lead Channel Pacing Threshold Amplitude: 0.75 V
Lead Channel Pacing Threshold Pulse Width: 0.4 ms
Lead Channel Pacing Threshold Pulse Width: 0.4 ms
Lead Channel Sensing Intrinsic Amplitude: 2.625 mV
Lead Channel Sensing Intrinsic Amplitude: 2.625 mV
Lead Channel Sensing Intrinsic Amplitude: 7.25 mV
Lead Channel Sensing Intrinsic Amplitude: 7.25 mV
Lead Channel Setting Pacing Amplitude: 2 V
Lead Channel Setting Pacing Amplitude: 2.5 V
Lead Channel Setting Pacing Pulse Width: 0.4 ms
Lead Channel Setting Sensing Sensitivity: 2.8 mV

## 2021-03-29 ENCOUNTER — Other Ambulatory Visit: Payer: Self-pay

## 2021-03-29 ENCOUNTER — Ambulatory Visit (HOSPITAL_COMMUNITY)
Admission: EM | Admit: 2021-03-29 | Discharge: 2021-03-29 | Disposition: A | Discharge status: Home / Self Care | Payer: Medicare Other | Attending: Medical Oncology | Admitting: Medical Oncology

## 2021-03-29 ENCOUNTER — Encounter (HOSPITAL_COMMUNITY): Payer: Self-pay

## 2021-03-29 DIAGNOSIS — L237 Allergic contact dermatitis due to plants, except food: Secondary | ICD-10-CM

## 2021-03-29 MED ORDER — PREDNISONE 10 MG PO TABS: ORAL_TABLET | ORAL | 0 refills | Status: DC

## 2021-03-29 MED ORDER — BETAMETHASONE DIPROPIONATE 0.05 % EX OINT: TOPICAL_OINTMENT | Freq: Two times a day (BID) | CUTANEOUS | 0 refills | Status: DC

## 2021-03-29 NOTE — ED Provider Notes (Signed)
[facility]    CSN: [id number] Arrival date & time: 03/29/21  1014      History   Chief Complaint Chief Complaint  Patient presents with   Poison Ivy    HPI Marvin Padilla is a 80 y.o. male.   HPI  Poison Ivy: Patient reports that 2 days ago he was working in his yard cutting down a tree that had fallen on his fence.  He states that he was hauling weight brush and came into contact with some poison ivy.  He states that normally he does have to have steroids when he has poison ivy.  He has involvement of an itchy rash of his arms and legs.  No fevers, skin breakdown or discharge.  Past Medical History:  Diagnosis Date   Generalized arthritis    High cholesterol    History of left bundle branch block (LBBB) 05/01/2015   Hypertension    Pacemaker    a. 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 Authorizing Provider  ACCU-CHEK GUIDE test strip  06/22/19   [provider]  amLODipine (NORVASC) 5 MG tablet Take 1 tablet by mouth daily. 01/19/21   [provider]  Blood Glucose Calibration (ACCU-CHEK GUIDE CONTROL) LIQD  06/22/19   [provider]  cephALEXin (KEFLEX) 500 MG capsule Take 1 capsule (500 mg total) by mouth 4 (four) times daily. 02/22/21   Coralyn Mark, NP  Clotrimazole (LOTRIMIN AF EX) Apply topically as needed.    [provider]  diphenhydramine-acetaminophen (TYLENOL PM) 25-500 MG TABS tablet Take 1 tablet by mouth at bedtime.    [provider]  docusate sodium (COLACE) 100 MG capsule Take 100 mg by mouth daily.    [provider]  finasteride (PROSCAR) 5 MG tablet Take 5 mg by mouth daily. 01/16/21   [provider]  glimepiride (AMARYL) 2 MG tablet Take 2 mg by mouth daily with breakfast.    [provider]  lisinopril-hydrochlorothiazide (PRINZIDE,ZESTORETIC) 20-25 MG tablet Take 1 tablet by mouth daily. 09/07/16   [provider]  meloxicam (MOBIC) 7.5 MG tablet Take 7.5 mg by mouth daily. 12/26/20   [provider]  metFORMIN (GLUCOPHAGE) 500 MG tablet Take 500 mg by mouth 2 (two) times daily with a meal.    [provider]  Multiple Vitamins-Minerals (MENS MULTIVITAMIN) TABS Take 1 tablet by mouth daily. 11/03/19   [provider]  simvastatin (ZOCOR) 40 MG tablet Take 40 mg by mouth daily. 04/02/15   [provider]  tamsulosin (FLOMAX) 0.4 MG CAPS  capsule Take 0.4 mg by mouth daily. 01/16/21   [provider]  terbinafine (LAMISIL) 250 MG tablet Take 250 mg by mouth daily. 06/29/19   [provider]  triamcinolone cream (KENALOG) 0.1 % Apply 1 application topically 2 (two) times daily. 02/15/21   Mardella Layman, MD    Family History Family History  Problem Relation Age of Onset   Heart failure Mother    Heart failure Father    Lung cancer Maternal Grandfather    Cancer Sister     Social History Social History   Tobacco Use   Smoking status: Never   Smokeless tobacco: Never  Vaping Use   Vaping Use: Never used  Substance Use Topics   Alcohol use: No   Drug use: No     Allergies   Patient  has no known allergies.   Review of Systems Review of Systems  As stated above in HPI Physical Exam Triage Vital Signs ED Triage Vitals  Enc Vitals Group     BP 03/29/21 1054 (!) 152/69     Pulse Rate 03/29/21 1054 66     Resp 03/29/21 1054 17     Temp 03/29/21 1054 98.5 F (36.9 C)     Temp Source 03/29/21 1054 Oral     SpO2 03/29/21 1054 99 %     Weight --      Height --      Head Circumference --      Peak Flow --      Pain Score 03/29/21 1051 1     Pain Loc --      Pain Edu? --      Excl. in GC? --    No data found.  Updated Vital Signs BP (!) 152/69 (BP Location: Right Arm)   Pulse 66   Temp 98.5 F (36.9 C) (Oral)   Resp 17   SpO2 99%   Physical Exam       Physical Exam Vitals and nursing note reviewed.  Constitutional:      Appearance: Normal appearance.  Neurological:     Mental Status: He is alert.  Psychiatric:        Mood and Affect: Mood normal.        Behavior: Behavior normal.  Skin: See above   UC Treatments / Results  Labs (all labs ordered are listed, but only abnormal results are displayed) Labs Reviewed - No data to display  EKG   Radiology No results found.  Procedures Procedures (including critical care time)  Medications Ordered in UC Medications - No data to display  Initial Impression / Assessment and Plan / UC Course  I have reviewed the triage vital signs and the nursing notes.  Pertinent labs & imaging results that were available during my care of the patient were reviewed by me and considered in my medical decision making (see chart for details).     New.  Given the limited amount of contact dermatitis I have recommended the patient trial of the betamethasone first before taking the prednisone.  He states that he is about to go on a trip and is nervous about not having prednisone on hand so I am going to send in low-dose prednisone for him to take only if needed.  Discussed risks benefits.  Follow-up as  needed. Final Clinical Impressions(s) / UC Diagnoses   Final diagnoses:  None   Discharge Instructions   None    ED Prescriptions   None    PDMP  not reviewed this encounter.   Rushie Chestnut, [place] 03/29/21 1123

## 2021-03-29 NOTE — ED Triage Notes (Signed)
Pt presents with poison ivy on both arms X 4 days.

## 2021-04-04 ENCOUNTER — Other Ambulatory Visit: Payer: Self-pay

## 2021-04-04 ENCOUNTER — Ambulatory Visit (HOSPITAL_COMMUNITY)
Admission: EM | Admit: 2021-04-04 | Discharge: 2021-04-04 | Disposition: A | Discharge status: Home / Self Care | Payer: Medicare Other | Attending: Student | Admitting: Student

## 2021-04-04 ENCOUNTER — Encounter (HOSPITAL_COMMUNITY): Payer: Self-pay | Admitting: Emergency Medicine

## 2021-04-04 DIAGNOSIS — Z23 Encounter for immunization: Secondary | ICD-10-CM | POA: Diagnosis not present

## 2021-04-04 DIAGNOSIS — E119 Type 2 diabetes mellitus without complications: Secondary | ICD-10-CM

## 2021-04-04 DIAGNOSIS — S81812A Laceration without foreign body, left lower leg, initial encounter: Secondary | ICD-10-CM

## 2021-04-04 DIAGNOSIS — Z7984 Long term (current) use of oral hypoglycemic drugs: Secondary | ICD-10-CM | POA: Diagnosis not present

## 2021-04-04 DIAGNOSIS — E1169 Type 2 diabetes mellitus with other specified complication: Secondary | ICD-10-CM

## 2021-04-04 DIAGNOSIS — E118 Type 2 diabetes mellitus with unspecified complications: Secondary | ICD-10-CM | POA: Diagnosis not present

## 2021-04-04 MED ORDER — TETANUS-DIPHTH-ACELL PERTUSSIS 5-2.5-18.5 LF-MCG/0.5 IM SUSY
PREFILLED_SYRINGE | INTRAMUSCULAR | Status: AC
  Filled 2021-04-04: qty 0.5

## 2021-04-04 MED ORDER — AMOXICILLIN-POT CLAVULANATE 875-125 MG PO TABS: 1.0000 | ORAL_TABLET | Freq: Two times a day (BID) | ORAL | 0 refills | Status: DC

## 2021-04-04 MED ORDER — TETANUS-DIPHTH-ACELL PERTUSSIS 5-2.5-18.5 LF-MCG/0.5 IM SUSY
0.5000 mL | PREFILLED_SYRINGE | Freq: Once | INTRAMUSCULAR | Status: AC
  Administered 2021-04-04: 0.5 mL via INTRAMUSCULAR

## 2021-04-04 NOTE — Discharge Instructions (Addendum)
-  Start the antibiotic-Augmentin (amoxicillin-clavulanate), 1 pill every 12 hours for 7 days.  You can take this with food like with breakfast and dinner. -Keep your sutures dry for 2 days. After that, wash your wound with gentle soap and water 1-2 times daily.  Let air dry or gently pat. Apply a bandage or bandaid to keep clean.  -Come back and see [place] in 7-10 days for suture removal.  -Seek additional immediate medical attention if symptoms worsen instead of improve, like redness and swelling surrounding the wound, discharge from the wound, new fever/chills.

## 2021-04-04 NOTE — ED Provider Notes (Signed)
[facility]    CSN: [id number] Arrival date & time: 04/04/21  1733      History   Chief Complaint Chief Complaint  Patient presents with   Leg Injury    HPI Marvin Padilla is a 80 y.o. male presenting with laceration L shin, sustained 4 hours ago. Medical history TBI, pacemaker, diabetes. States he was working in his shop when he ran into a sharp piece of wood. Poured hydrogen peroxide on it and came straight to the urgent care. Denies falls or other abrasion/laceration. Denies sensation changes. Tdap not UTD. He is not on a blood thiner.  HPI  Past Medical History:  Diagnosis Date   Generalized arthritis    High cholesterol    History of left bundle branch block (LBBB) 05/01/2015   Hypertension    Pacemaker    a. 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 Authorizing Provider  amoxicillin-clavulanate (AUGMENTIN) 875-125 MG tablet Take 1 tablet by mouth every 12 (twelve) hours. 04/04/21  Yes Rhys MartiniGraham, Redith Drach E, PA-C  ACCU-CHEK GUIDE test strip  06/22/19   [provider]  amLODipine (NORVASC) 5 MG tablet Take 1 tablet by mouth daily. 01/19/21   [provider]  betamethasone dipropionate (DIPROLENE) 0.05 % ointment Apply topically 2 (two) times daily. 03/29/21   Rushie Chestnutovington, Sarah M, PA-C  Blood Glucose Calibration (ACCU-CHEK GUIDE CONTROL) LIQD  06/22/19   [provider]  Clotrimazole (LOTRIMIN AF EX) Apply topically as needed.    [provider]  diphenhydramine-acetaminophen (TYLENOL PM) 25-500 MG TABS tablet Take 1 tablet by mouth at bedtime.    [provider]  docusate sodium (COLACE) 100 MG capsule Take 100 mg by mouth daily.    [provider]  finasteride (PROSCAR) 5 MG tablet Take 5 mg by mouth daily. 01/16/21   [provider]  glimepiride (AMARYL) 2 MG tablet Take 2 mg by mouth daily with breakfast.    [provider]  lisinopril-hydrochlorothiazide (PRINZIDE,ZESTORETIC) 20-25 MG tablet Take 1 tablet by mouth daily. 09/07/16   [provider]  meloxicam (MOBIC) 7.5 MG tablet Take 7.5 mg by mouth daily. 12/26/20   [provider]  metFORMIN (GLUCOPHAGE) 500 MG tablet Take 500 mg by mouth 2 (two) times daily with a meal.    [provider]  Multiple Vitamins-Minerals (MENS MULTIVITAMIN) TABS Take 1 tablet by mouth daily. 11/03/19   [provider]  predniSONE (DELTASONE) 10 MG tablet Take 4 tablets by mouth with breakfast for 2 days, 2 tablets by mouth for 2 days and 1 tablet  by mouth for 2 days. 03/29/21   Rushie Chestnut, PA-C  simvastatin (ZOCOR) 40 MG tablet Take 40 mg by mouth daily. 04/02/15   [provider]  tamsulosin (FLOMAX) 0.4 MG CAPS capsule Take 0.4 mg by mouth daily. 01/16/21   [provider]  terbinafine (LAMISIL) 250 MG tablet Take 250 mg by mouth daily. 06/29/19   [provider]  triamcinolone cream (KENALOG) 0.1 % Apply 1 application topically 2 (two) times daily. 02/15/21   Mardella Layman, MD    Family History Family History  Problem Relation Age of Onset   Heart failure Mother    Heart failure  Father    Lung cancer Maternal Grandfather    Cancer Sister     Social History Social History   Tobacco Use   Smoking status: Never   Smokeless tobacco: Never  Vaping Use   Vaping Use: Never used  Substance Use Topics   Alcohol use: No   Drug use: No     Allergies   Patient has no known allergies.   Review of Systems Review of Systems  Skin:  Positive for wound.  All other systems reviewed and are negative.   Physical Exam Triage Vital Signs ED Triage Vitals  Enc Vitals Group     BP 04/04/21 1804 (!) 166/72     Pulse Rate 04/04/21 1804 76     Resp 04/04/21 1804 17     Temp 04/04/21 1804 98.4 F (36.9 C)     Temp Source 04/04/21 1804 Oral     SpO2 04/04/21 1804 98 %     Weight --      Height --      Head Circumference --      Peak Flow --      Pain Score 04/04/21 1801 3     Pain Loc --      Pain Edu? --      Excl. in GC? --    No data found.  Updated Vital Signs BP (!) 166/72 (BP Location: Left Arm)   Pulse 76   Temp 98.4 F (36.9 C) (Oral)   Resp 17   SpO2 98%   Visual Acuity Right Eye Distance:   Left Eye Distance:   Bilateral Distance:    Right Eye Near:   Left Eye Near:    Bilateral Near:     Physical Exam Vitals reviewed.  Constitutional:      General: He is not in acute distress.    Appearance: Normal appearance. He is not ill-appearing or diaphoretic.  HENT:     Head: Normocephalic and atraumatic.  Cardiovascular:     Rate and Rhythm: Normal rate and regular rhythm.     Heart sounds: Normal heart sounds.  Pulmonary:     Effort: Pulmonary effort is normal.     Breath sounds: Normal breath sounds.  Skin:    General: Skin is warm.     Findings: Laceration present.     Comments: See image below 2.7 cm laceration to L shin, actively bleeding. No foreign body or sensation changes.   Neurological:     General: No focal deficit present.     Mental Status: He is alert and oriented to person, place, and time.  Psychiatric:         Mood and Affect: Mood normal.        Behavior: Behavior normal.        Thought Content: Thought content normal.  Judgment: Judgment normal.         UC Treatments / Results  Labs (all labs ordered are listed, but only abnormal results are displayed) Labs Reviewed - No data to display  EKG   Radiology No results found.  Procedures Laceration Repair  Date/Time: 04/04/2021 7:29 PM Performed by: Rhys Martini, PA-C Authorized by: Rhys Martini, PA-C   Consent:    Consent obtained:  Verbal   Consent given by:  Patient   Risks, benefits, and alternatives were discussed: yes     Risks discussed:  Infection, pain, poor cosmetic result and poor wound healing Universal protocol:    Procedure explained and questions answered to patient or proxy's satisfaction: yes     Patient identity confirmed:  Verbally with patient Anesthesia:    Anesthesia method:  Local infiltration   Local anesthetic:  Lidocaine 1% WITH epi Laceration details:    Location:  Leg   Leg location:  L lower leg   Length (cm):  2.7 Pre-procedure details:    Preparation:  Patient was prepped and draped in usual sterile fashion Exploration:    Hemostasis achieved with:  Epinephrine   Imaging outcome: foreign body not noted   Treatment:    Area cleansed with:  Povidone-iodine   Amount of cleaning:  Extensive   Irrigation solution:  Sterile saline Skin repair:    Repair method:  Sutures   Suture size:  6-0   Wound skin closure material used: ethilon.   Suture technique:  Simple interrupted   Number of sutures:  5 Approximation:    Approximation:  Loose Repair type:    Repair type:  Simple Post-procedure details:    Dressing:  Non-adherent dressing   Procedure completion:  Tolerated well, no immediate complications (including critical care time)  Medications Ordered in UC Medications  Tdap (BOOSTRIX) injection 0.5 mL (has no administration in time range)    Initial Impression /  Assessment and Plan / UC Course  I have reviewed the triage vital signs and the nursing notes.  Pertinent labs & imaging results that were available during my care of the patient were reviewed by me and considered in my medical decision making (see chart for details).     This patient is a very pleasant 80 y.o. year old male presenting with laceration sustained 4 hours ago.   5 nonabsorbable sutures administered as above. Tdap not UTD, administered today.  Pt is a diabetic. Augmentin sent for infection prophylaxis as below.  Wound care discussed.  Return in 10 days for suture removal.  ED return precautions discussed. Patient verbalizes understanding and agreement.   Final Clinical Impressions(s) / UC Diagnoses   Final diagnoses:  Laceration of left lower extremity, initial encounter  Need for Tdap vaccination  Type 2 diabetes mellitus with other specified complication, without long-term current use of insulin (HCC)  Diabetes mellitus treated with oral medication (HCC)     Discharge Instructions      -Start the antibiotic-Augmentin (amoxicillin-clavulanate), 1 pill every 12 hours for 7 days.  You can take this with food like with breakfast and dinner. -Keep your sutures dry for 2 days. After that, wash your wound with gentle soap and water 1-2 times daily.  Let air dry or gently pat. Apply a bandage or bandaid to keep clean.  -Come back and see [place] in 7-10 days for suture removal.  -Seek additional immediate medical attention if symptoms worsen instead of improve, like redness and swelling surrounding the wound, discharge  from the wound, new fever/chills.      ED Prescriptions     Medication Sig Dispense Auth. Provider   amoxicillin-clavulanate (AUGMENTIN) 875-125 MG tablet Take 1 tablet by mouth every 12 (twelve) hours. 14 tablet Rhys Martini, PA-C      PDMP not reviewed this encounter.   Rhys Martini, PA-C 04/05/21 [phone]

## 2021-04-04 NOTE — ED Triage Notes (Signed)
Pt presents with left leg laceration xs 4 hours. States tripped and fell into a piece of wood.

## 2021-04-05 NOTE — Progress Notes (Signed)
Remote pacemaker transmission.   

## 2021-04-17 ENCOUNTER — Ambulatory Visit (HOSPITAL_COMMUNITY)
Admission: EM | Admit: 2021-04-17 | Discharge: 2021-04-17 | Disposition: A | Discharge status: Home / Self Care | Payer: Medicare Other | Attending: Emergency Medicine | Admitting: Emergency Medicine

## 2021-04-17 ENCOUNTER — Other Ambulatory Visit: Payer: Self-pay

## 2021-04-17 ENCOUNTER — Encounter (HOSPITAL_COMMUNITY): Payer: Self-pay

## 2021-04-17 DIAGNOSIS — S81812D Laceration without foreign body, left lower leg, subsequent encounter: Secondary | ICD-10-CM | POA: Diagnosis not present

## 2021-04-17 MED ORDER — CEPHALEXIN 500 MG PO CAPS: 1000.0000 mg | ORAL_CAPSULE | Freq: Two times a day (BID) | ORAL | 0 refills | Status: DC

## 2021-04-17 NOTE — Discharge Instructions (Addendum)
Take antibiotic( 2 pills, in the morning, 2 pills in the evening) twice a day for 5 days   Can clean wound with mild soap and water, pat dry, stop use of hydrogen peroxide  Watch for signs of infection, spreading of red skin, drainage, pain, swelling, skin become hot, if this occurs may follow up at urgent care

## 2021-04-17 NOTE — ED Triage Notes (Signed)
Pt presents for wound check in the left lower leg. States he cut the lower left leg with a piece of  wood on 04/04/2021 and was seeing here and had 5 sutures. Reports she is cleaning the wound with hydrogen peroxide. Wound is red and swelling. Denies fever, chills, drainage.

## 2021-04-17 NOTE — ED Provider Notes (Signed)
[facility]    CSN: [id number] Arrival date & time: 04/17/21  6720      History   Chief Complaint Chief Complaint  Patient presents with   Wound Check    HPI Marvin Padilla is a 80 y.o. male  Patient presents for suture removal of laceration to left lower leg requesting wound check due to redness around wound bed. Redness has not spread.  Denies fever, chills, drainage, warmth of skin, swelling or pain. Completed prescribed prophylactic antibiotics. Has been cleansing wound daily with hydrogen peroxide. History of DM 2, controlled, HTN, HLD.   Past Medical History:  Diagnosis Date   Generalized arthritis    High cholesterol    History of left bundle branch block (LBBB) 05/01/2015   Hypertension    Pacemaker    a. 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 Authorizing Provider  ACCU-CHEK GUIDE test strip  06/22/19   [provider]  amLODipine (NORVASC) 5 MG tablet Take 1 tablet by mouth daily. 01/19/21   [provider]  betamethasone dipropionate (DIPROLENE) 0.05 % ointment Apply topically 2 (two) times daily. 03/29/21   Rushie Chestnut, PA-C   Blood Glucose Calibration (ACCU-CHEK GUIDE CONTROL) LIQD  06/22/19   [provider]  cephALEXin (KEFLEX) 500 MG capsule Take 2 capsules (1,000 mg total) by mouth 2 (two) times daily. 04/17/21   Tashera Montalvo, Elita Boone, NP  Clotrimazole (LOTRIMIN AF EX) Apply topically as needed.    [provider]  diphenhydramine-acetaminophen (TYLENOL PM) 25-500 MG TABS tablet Take 1 tablet by mouth at bedtime.    [provider]  docusate sodium (COLACE) 100 MG capsule Take 100 mg by mouth daily.    [provider]  finasteride (PROSCAR) 5 MG tablet Take 5 mg by mouth daily. 01/16/21   [provider]  glimepiride (AMARYL) 2 MG tablet Take 2 mg by mouth daily with breakfast.    [provider]  lisinopril-hydrochlorothiazide (PRINZIDE,ZESTORETIC) 20-25 MG tablet Take 1 tablet by mouth daily. 09/07/16   [provider]  meloxicam (MOBIC) 7.5 MG tablet Take 7.5 mg by mouth daily. 12/26/20   [provider]  metFORMIN (GLUCOPHAGE) 500 MG tablet Take 500 mg by mouth 2 (two) times daily with a meal.    [provider]  Multiple Vitamins-Minerals (MENS MULTIVITAMIN) TABS Take 1 tablet by mouth daily. 11/03/19   [provider]  predniSONE (DELTASONE) 10 MG tablet Take 4 tablets by mouth with breakfast for 2 days, 2 tablets by mouth for 2 days and 1 tablet by mouth for  2 days. 03/29/21   Rushie Chestnut, PA-C  simvastatin (ZOCOR) 40 MG tablet Take 40 mg by mouth daily. 04/02/15   [provider]  tamsulosin (FLOMAX) 0.4 MG CAPS capsule Take 0.4 mg by mouth daily. 01/16/21   [provider]  terbinafine (LAMISIL) 250 MG tablet Take 250 mg by mouth daily. 06/29/19   [provider]  triamcinolone cream (KENALOG) 0.1 % Apply 1 application topically 2 (two) times daily. 02/15/21   Mardella Layman, MD    Family History Family History  Problem Relation Age of Onset   Heart failure Mother    Heart failure Father    Lung  cancer Maternal Grandfather    Cancer Sister     Social History Social History   Tobacco Use   Smoking status: Never   Smokeless tobacco: Never  Vaping Use   Vaping Use: Never used  Substance Use Topics   Alcohol use: No   Drug use: No     Allergies   Patient has no known allergies.   Review of Systems Review of Systems  Constitutional: Negative.   Respiratory: Negative.    Cardiovascular: Negative.   Skin:  Positive for wound. Negative for color change, pallor and rash.  Neurological: Negative.     Physical Exam Triage Vital Signs ED Triage Vitals  Enc Vitals Group     BP 04/17/21 0913 (!) 161/72     Pulse Rate 04/17/21 0913 62     Resp 04/17/21 0913 18     Temp 04/17/21 0913 98 F (36.7 C)     Temp Source 04/17/21 0913 Oral     SpO2 04/17/21 0913 100 %     Weight --      Height --      Head Circumference --      Peak Flow --      Pain Score 04/17/21 0912 0     Pain Loc --      Pain Edu? --      Excl. in GC? --    No data found.  Updated Vital Signs BP (!) 161/72 (BP Location: Right Arm)   Pulse 62   Temp 98 F (36.7 C) (Oral)   Resp 18   SpO2 100%   Visual Acuity Right Eye Distance:   Left Eye Distance:   Bilateral Distance:    Right Eye Near:   Left Eye Near:    Bilateral Near:     Physical Exam Constitutional:      Appearance: Normal appearance. He is normal weight.  Eyes:     Extraocular Movements: Extraocular movements intact.  Pulmonary:     Effort: Pulmonary effort is normal.  Musculoskeletal:        General: Normal range of motion.  Skin:    Comments: Healing laceration to left lateral lower leg, wound bed has closed with scar present, mild contained erythema around wound bed, skin not hot, no swelling, drainage or tenderness noted.  Neurological:     Mental Status: He is alert and oriented to person, place, and time. Mental status is at baseline.  Psychiatric:        Mood and Affect: Mood normal.        Behavior: Behavior  normal.     UC Treatments / Results  Labs (all labs ordered are listed, but only abnormal results are displayed) Labs Reviewed - No data to display  EKG   Radiology No results found.  Procedures Procedures (including critical care time)  Medications Ordered in UC Medications - No data to display  Initial Impression / Assessment and Plan / UC Course  I have reviewed the triage vital signs and the nursing notes.  Pertinent labs & imaging results that were available during my care of the patient were reviewed by me and considered in my medical decision making (see chart for details).  Laceration of left lower leg, subsequent encounter  1.Keflex 1000 mg bid for 5 days 2. Advised cessation of hydrogen peroxide, cleanse with mild soap and water and pat dry 3. Strict return precautions given for signs of infection 4. Ok 'd for suture removal by nursing staff  Final Clinical Impressions(s) / UC Diagnoses   Final diagnoses:  Laceration of left lower leg, subsequent encounter     Discharge Instructions      Take antibiotic( 2 pills, in the morning, 2 pills in the evening) twice a day for 5 days   Can clean wound with mild soap and water, pat dry, stop use of hydrogen peroxide  Watch for signs of infection, spreading of red skin, drainage, pain, swelling, skin become hot, if this occurs may follow up at urgent care     ED Prescriptions     Medication Sig Dispense Auth. Provider   cephALEXin (KEFLEX) 500 MG capsule  (Status: Discontinued) Take 2 capsules (1,000 mg total) by mouth 2 (two) times daily. 20 capsule Veneda Kirksey R, NP   cephALEXin (KEFLEX) 500 MG capsule Take 2 capsules (1,000 mg total) by mouth 2 (two) times daily. 20 capsule Valinda Hoar, NP      PDMP not reviewed this encounter.   Valinda Hoar, NP 04/17/21 1042

## 2021-05-23 ENCOUNTER — Other Ambulatory Visit: Payer: Self-pay

## 2021-05-23 ENCOUNTER — Encounter (HOSPITAL_COMMUNITY): Payer: Self-pay

## 2021-05-23 ENCOUNTER — Ambulatory Visit (HOSPITAL_COMMUNITY)
Admission: EM | Admit: 2021-05-23 | Discharge: 2021-05-23 | Disposition: A | Discharge status: Home / Self Care | Payer: Medicare Other | Attending: Urgent Care | Admitting: Urgent Care

## 2021-05-23 DIAGNOSIS — L255 Unspecified contact dermatitis due to plants, except food: Secondary | ICD-10-CM | POA: Diagnosis not present

## 2021-05-23 DIAGNOSIS — L299 Pruritus, unspecified: Secondary | ICD-10-CM

## 2021-05-23 MED ORDER — BETAMETHASONE DIPROPIONATE 0.05 % EX OINT: TOPICAL_OINTMENT | Freq: Two times a day (BID) | CUTANEOUS | 0 refills | Status: DC

## 2021-05-23 MED ORDER — CETIRIZINE HCL 5 MG PO TABS: 5.0000 mg | ORAL_TABLET | Freq: Every day | ORAL | 0 refills | Status: DC

## 2021-05-23 NOTE — ED Triage Notes (Signed)
Pt presents with a rash on his arms and legs.  States he is concerned of being exposed to poison ivy.

## 2021-05-23 NOTE — ED Provider Notes (Signed)
[facility]   MRN: [id number] DOB: Mar 31, 1941  Subjective:   Marvin Padilla is a 80 y.o. male presenting for 1 week history of acute persistent rash over his forearms bilaterally, 2 itchy spots from 2 bug bites on his left lower leg. Denies fever, oral or facial swelling, chest tightness, nausea, vomiting, tenderness, drainage of pus or bleeding, rash over the face, genital area.  Symptoms started after he was working on clearing yard debris, had to clear to fall injuries that were covered in poison ivy.  Has been using a specific soap available over-the-counter for poison ivy, Benadryl.  Last a1c was 11/02/2020 and was 6.4%.  No current facility-administered medications for this encounter.  Current Outpatient Medications:    ACCU-CHEK GUIDE test strip, , Disp: , Rfl:    amLODipine (NORVASC) 5 MG tablet, Take 1 tablet by mouth daily., Disp: , Rfl:    betamethasone dipropionate (DIPROLENE) 0.05 % ointment, Apply topically 2 (two) times daily., Disp: 30 g, Rfl: 0   Blood Glucose Calibration (ACCU-CHEK GUIDE CONTROL) LIQD, , Disp: , Rfl:    cephALEXin (KEFLEX) 500 MG capsule, Take 2 capsules (1,000 mg total) by mouth 2 (two) times daily., Disp: 20 capsule, Rfl: 0   Clotrimazole (LOTRIMIN AF EX), Apply topically as needed., Disp: , Rfl:    diphenhydramine-acetaminophen (TYLENOL PM) 25-500 MG TABS tablet, Take 1 tablet by mouth at bedtime., Disp: , Rfl:    docusate sodium (COLACE) 100 MG capsule, Take 100 mg by mouth daily., Disp: , Rfl:    finasteride (PROSCAR) 5 MG tablet, Take 5 mg by mouth daily., Disp: , Rfl:    glimepiride (AMARYL) 2 MG tablet, Take 2 mg by mouth daily with breakfast., Disp: , Rfl:    lisinopril-hydrochlorothiazide (PRINZIDE,ZESTORETIC) 20-25 MG tablet, Take 1 tablet by mouth daily., Disp: , Rfl:    meloxicam (MOBIC) 7.5 MG tablet, Take 7.5 mg by mouth daily., Disp: , Rfl:    metFORMIN (GLUCOPHAGE) 500 MG tablet, Take 500 mg by mouth 2 (two) times daily  with a meal., Disp: , Rfl:    Multiple Vitamins-Minerals (MENS MULTIVITAMIN) TABS, Take 1 tablet by mouth daily., Disp: , Rfl:    predniSONE (DELTASONE) 10 MG tablet, Take 4 tablets by mouth with breakfast for 2 days, 2 tablets by mouth for 2 days and 1 tablet by mouth for 2 days., Disp: 14 tablet, Rfl: 0   simvastatin (ZOCOR) 40 MG tablet, Take 40 mg by mouth daily., Disp: , Rfl: 4   tamsulosin (FLOMAX) 0.4 MG CAPS capsule, Take 0.4 mg by mouth daily., Disp: , Rfl:    terbinafine (LAMISIL) 250 MG tablet, Take 250 mg by mouth daily., Disp: , Rfl:    triamcinolone cream (KENALOG) 0.1 %, Apply 1 application topically 2 (two) times daily., Disp: 15 g, Rfl: 0   No Known Allergies  Past Medical History:  Diagnosis Date   Generalized arthritis    High cholesterol    History of left bundle branch block (LBBB) 05/01/2015   Hypertension    Pacemaker    a. 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 for two weeks     Past Surgical History:  Procedure Laterality Date   BACK SURGERY     EP IMPLANTABLE DEVICE N/A 09/14/2016   Procedure: Pacemaker Implant;  Surgeon: Marinus Maw, MD;  [facility];  Service: Cardiovascular;  Laterality: N/A;   NOSE SURGERY  PACEMAKER IMPLANT  09/14/2016    Family History  Problem Relation Age of Onset   Heart failure Mother    Heart failure Father    Lung cancer Maternal Grandfather    Cancer Sister     Social History   Tobacco Use   Smoking status: Never   Smokeless tobacco: Never  Vaping Use   Vaping Use: Never used  Substance Use Topics   Alcohol use: No   Drug use: No    ROS   Objective:   Vitals: BP (!) 147/67 (BP Location: Right Arm)   Pulse 62   Temp 98 F (36.7 C) (Oral)   Resp 19   SpO2 98%   Physical Exam Constitutional:      General: He is not in acute distress.    Appearance: Normal appearance. He is well-developed and  normal weight. He is not ill-appearing, toxic-appearing or diaphoretic.  HENT:     Head: Normocephalic and atraumatic.     Right Ear: External ear normal.     Left Ear: External ear normal.     Nose: Nose normal.     Mouth/Throat:     Pharynx: Oropharynx is clear.  Eyes:     General: No scleral icterus.       Right eye: No discharge.        Left eye: No discharge.     Extraocular Movements: Extraocular movements intact.     Pupils: Pupils are equal, round, and reactive to light.  Cardiovascular:     Rate and Rhythm: Normal rate.  Pulmonary:     Effort: Pulmonary effort is normal.  Musculoskeletal:     Cervical back: Normal range of motion.  Skin:    Findings: Rash (Large maculopapular slightly oozing rash over the medial aspect of his forearms bilaterally, 2 macular lesions with resolving wound centrally over the medial aspect of the left lower leg) present.  Neurological:     Mental Status: He is alert and oriented to person, place, and time.  Psychiatric:        Mood and Affect: Mood normal.        Behavior: Behavior normal.        Thought Content: Thought content normal.        Judgment: Judgment normal.      Assessment and Plan :   PDMP not reviewed this encounter.  1. Rhus dermatitis   2. Itching     As patient does not have facial or genital involvement of his wrist dermatitis, recommended topical steroids, Zyrtec.  Also recommended the steroid ointment for contact dermatitis of the left lower leg secondary to joint known bug bites. Counseled patient on potential for adverse effects with medications prescribed/recommended today, ER and return-to-clinic precautions discussed, patient verbalized understanding.    Wallis Bamberg, [place] 05/23/21 1513

## 2021-06-13 ENCOUNTER — Ambulatory Visit (INDEPENDENT_AMBULATORY_CARE_PROVIDER_SITE_OTHER): Payer: Medicare Other

## 2021-06-13 DIAGNOSIS — I442 Atrioventricular block, complete: Secondary | ICD-10-CM | POA: Diagnosis not present

## 2021-06-14 LAB — CUP PACEART REMOTE DEVICE CHECK
Battery Remaining Longevity: 59 mo
Battery Voltage: 3 V
Brady Statistic AP VP Percent: 31.48 %
Brady Statistic AP VS Percent: 0.03 %
Brady Statistic AS VP Percent: 68.44 %
Brady Statistic AS VS Percent: 0.05 %
Brady Statistic RA Percent Paced: 31.51 %
Brady Statistic RV Percent Paced: 99.91 %
Date Time Interrogation Session: 20220914101532
Implantable Lead Implant Date: 20171215
Implantable Lead Implant Date: 20171215
Implantable Lead Location: 753859
Implantable Lead Location: 753860
Implantable Lead Model: 5076
Implantable Lead Model: 5076
Implantable Pulse Generator Implant Date: 20171215
Lead Channel Impedance Value: 342 Ohm
Lead Channel Impedance Value: 380 Ohm
Lead Channel Impedance Value: 437 Ohm
Lead Channel Impedance Value: 456 Ohm
Lead Channel Pacing Threshold Amplitude: 0.625 V
Lead Channel Pacing Threshold Amplitude: 0.75 V
Lead Channel Pacing Threshold Pulse Width: 0.4 ms
Lead Channel Pacing Threshold Pulse Width: 0.4 ms
Lead Channel Sensing Intrinsic Amplitude: 2.375 mV
Lead Channel Sensing Intrinsic Amplitude: 2.375 mV
Lead Channel Sensing Intrinsic Amplitude: 5.25 mV
Lead Channel Sensing Intrinsic Amplitude: 5.25 mV
Lead Channel Setting Pacing Amplitude: 2 V
Lead Channel Setting Pacing Amplitude: 2.5 V
Lead Channel Setting Pacing Pulse Width: 0.4 ms
Lead Channel Setting Sensing Sensitivity: 2.8 mV

## 2021-06-21 NOTE — Progress Notes (Signed)
Remote pacemaker transmission.   

## 2021-07-10 ENCOUNTER — Encounter: Payer: Self-pay | Admitting: *Deleted

## 2021-07-10 ENCOUNTER — Other Ambulatory Visit: Payer: Self-pay

## 2021-07-10 ENCOUNTER — Institutional Professional Consult (permissible substitution): Payer: Medicare Other | Admitting: Neurology

## 2021-07-11 ENCOUNTER — Encounter: Payer: Self-pay | Admitting: *Deleted

## 2021-07-12 ENCOUNTER — Other Ambulatory Visit: Payer: Self-pay

## 2021-07-12 ENCOUNTER — Encounter: Payer: Self-pay | Admitting: Neurology

## 2021-07-12 ENCOUNTER — Ambulatory Visit: Payer: Medicare Other | Admitting: Neurology

## 2021-07-12 VITALS — BP 137/81 | HR 68 | Ht 70.0 in | Wt 210.4 lb

## 2021-07-12 DIAGNOSIS — R413 Other amnesia: Secondary | ICD-10-CM | POA: Diagnosis not present

## 2021-07-12 DIAGNOSIS — G4719 Other hypersomnia: Secondary | ICD-10-CM

## 2021-07-12 DIAGNOSIS — R410 Disorientation, unspecified: Secondary | ICD-10-CM

## 2021-07-12 DIAGNOSIS — R419 Unspecified symptoms and signs involving cognitive functions and awareness: Secondary | ICD-10-CM | POA: Diagnosis not present
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
## 2021-07-12 NOTE — Patient Instructions (Addendum)
MRI of the brain or CT of head Blood work Formal memory testing - Dr. Kieth Brightly Sleep apnea - see Dr. Vickey Huger, get a test and then treatment Then follow up with [place]  Sleep Apnea Sleep apnea is a condition in which breathing pauses or becomes shallow during sleep. People with sleep apnea usually snore loudly. They may have times when they gasp and stop breathing for 10 seconds or more during sleep. This may happen many times during the night. Sleep apnea disrupts your sleep and keeps your body from getting the rest that it needs. This condition can increase your risk of certain health problems, including: Heart attack. Stroke. Obesity. Type 2 diabetes. Heart failure. Irregular heartbeat. High blood pressure. The goal of treatment is to help you breathe normally again. What are the causes? The most common cause of sleep apnea is a collapsed or blocked airway. There are three kinds of sleep apnea: Obstructive sleep apnea. This kind is caused by a blocked or collapsed airway. Central sleep apnea. This kind happens when the part of the brain that controls breathing does not send the correct signals to the muscles that control breathing. Mixed sleep apnea. This is a combination of obstructive and central sleep apnea. What increases the risk? You are more likely to develop this condition if you: Are overweight. Smoke. Have a smaller than normal airway. Are older. Are male. Drink alcohol. Take sedatives or tranquilizers. Have a family history of sleep apnea. Have a tongue or tonsils that are larger than normal. What are the signs or symptoms? Symptoms of this condition include: Trouble staying asleep. Loud snoring. Morning headaches. Waking up gasping. Dry mouth or sore throat in the morning. Daytime sleepiness and tiredness. If you have daytime fatigue because of sleep apnea, you may be more likely to have: Trouble concentrating. Forgetfulness. Irritability or mood  swings. Personality changes. Feelings of depression. Sexual dysfunction. This may include loss of interest if you are male, or erectile dysfunction if you are male. How is this diagnosed? This condition may be diagnosed with: A medical history. A physical exam. A series of tests that are done while you are sleeping (sleep study). These tests are usually done in a sleep lab, but they may also be done at home. How is this treated? Treatment for this condition aims to restore normal breathing and to ease symptoms during sleep. It may involve managing health issues that can affect breathing, such as high blood pressure or obesity. Treatment may include: Sleeping on your side. Using a decongestant if you have nasal congestion. Avoiding the use of depressants, including alcohol, sedatives, and narcotics. Losing weight if you are overweight. Making changes to your diet. Quitting smoking. Using a device to open your airway while you sleep, such as: An oral appliance. This is a custom-made mouthpiece that shifts your lower jaw forward. A continuous positive airway pressure (CPAP) device. This device blows air through a mask when you breathe out (exhale). A nasal expiratory positive airway pressure (EPAP) device. This device has valves that you put into each nostril. A bi-level positive airway pressure (BPAP) device. This device blows air through a mask when you breathe in (inhale) and breathe out (exhale). Having surgery if other treatments do not work. During surgery, excess tissue is removed to create a wider airway. Follow these instructions at home: Lifestyle Make any lifestyle changes that your health care provider recommends. Eat a healthy, well-balanced diet. Take steps to lose weight if you are overweight. Avoid using depressants,  including alcohol, sedatives, and narcotics. Do not use any products that contain nicotine or tobacco. These products include cigarettes, chewing tobacco, and  vaping devices, such as e-cigarettes. If you need help quitting, ask your health care provider. General instructions Take over-the-counter and prescription medicines only as told by your health care provider. If you were given a device to open your airway while you sleep, use it only as told by your health care provider. If you are having surgery, make sure to tell your health care provider you have sleep apnea. You may need to bring your device with you. Keep all follow-up visits. This is important. Contact a health care provider if: The device that you received to open your airway during sleep is uncomfortable or does not seem to be working. Your symptoms do not improve. Your symptoms get worse. Get help right away if: You develop: Chest pain. Shortness of breath. Discomfort in your back, arms, or stomach. You have: Trouble speaking. Weakness on one side of your body. Drooping in your face. These symptoms may represent a serious problem that is an emergency. Do not wait to see if the symptoms will go away. Get medical help right away. Call your local emergency services (911 in the [place] Do not drive yourself to the hospital. Summary Sleep apnea is a condition in which breathing pauses or becomes shallow during sleep. The most common cause is a collapsed or blocked airway. The goal of treatment is to restore normal breathing and to ease symptoms during sleep. This information is not intended to replace advice given to you by your health care provider. Make sure you discuss any questions you have with your health care provider. Document Revised: 08/26/2020 Document Reviewed: 08/26/2020 Elsevier Patient Education  2022 [employer].

## 2021-07-13 ENCOUNTER — Other Ambulatory Visit: Payer: Self-pay

## 2021-07-13 ENCOUNTER — Other Ambulatory Visit (INDEPENDENT_AMBULATORY_CARE_PROVIDER_SITE_OTHER): Payer: Self-pay

## 2021-07-13 DIAGNOSIS — Z0289 Encounter for other administrative examinations: Secondary | ICD-10-CM

## 2021-07-15 ENCOUNTER — Encounter: Payer: Self-pay | Admitting: Neurology

## 2021-07-15 DIAGNOSIS — R413 Other amnesia: Secondary | ICD-10-CM | POA: Insufficient documentation

## 2021-07-16 LAB — COMPREHENSIVE METABOLIC PANEL
ALT: 19 IU/L (ref 0–44)
AST: 18 IU/L (ref 0–40)
Albumin/Globulin Ratio: 2.4 — ABNORMAL HIGH (ref 1.2–2.2)
Albumin: 4.6 g/dL (ref 3.7–4.7)
Alkaline Phosphatase: 92 IU/L (ref 44–121)
BUN/Creatinine Ratio: 20 (ref 10–24)
BUN: 18 mg/dL (ref 8–27)
Bilirubin Total: 0.5 mg/dL (ref 0.0–1.2)
CO2: 23 mmol/L (ref 20–29)
Calcium: 9.6 mg/dL (ref 8.6–10.2)
Chloride: 99 mmol/L (ref 96–106)
Creatinine, Ser: 0.89 mg/dL (ref 0.76–1.27)
Globulin, Total: 1.9 g/dL (ref 1.5–4.5)
Glucose: 137 mg/dL — ABNORMAL HIGH (ref 70–99)
Potassium: 4.6 mmol/L (ref 3.5–5.2)
Sodium: 138 mmol/L (ref 134–144)
Total Protein: 6.5 g/dL (ref 6.0–8.5)
eGFR: 88 mL/min/{1.73_m2} (ref >59)

## 2021-07-16 LAB — CBC WITH DIFFERENTIAL/PLATELET
Basophils Absolute: 0.1 10*3/uL (ref 0.0–0.2)
Basos: 1 %
EOS (ABSOLUTE): 0.2 10*3/uL (ref 0.0–0.4)
Eos: 5 %
Hematocrit: 43.5 % (ref 37.5–51.0)
Hemoglobin: 14.8 g/dL (ref 13.0–17.7)
Immature Grans (Abs): 0 10*3/uL (ref 0.0–0.1)
Immature Granulocytes: 0 %
Lymphocytes Absolute: 1.8 10*3/uL (ref 0.7–3.1)
Lymphs: 36 %
MCH: 31.3 pg (ref 26.6–33.0)
MCHC: 34 g/dL (ref 31.5–35.7)
MCV: 92 fL (ref 79–97)
Monocytes Absolute: 0.4 10*3/uL (ref 0.1–0.9)
Monocytes: 8 %
Neutrophils Absolute: 2.5 10*3/uL (ref 1.4–7.0)
Neutrophils: 50 %
Platelets: 168 10*3/uL (ref 150–450)
RBC: 4.73 x10E6/uL (ref 4.14–5.80)
RDW: 12.5 % (ref 11.6–15.4)
WBC: 5.1 10*3/uL (ref 3.4–10.8)

## 2021-07-16 LAB — VITAMIN B1: Thiamine: 171.7 nmol/L (ref 66.5–200.0)

## 2021-07-16 LAB — B12 AND FOLATE PANEL
Folate: >20 ng/mL (ref >3.0)
Vitamin B-12: 723 pg/mL (ref 232–1245)

## 2021-07-16 LAB — METHYLMALONIC ACID, SERUM: Methylmalonic Acid: 185 nmol/L (ref 0–378)

## 2021-07-16 LAB — HOMOCYSTEINE: Homocysteine: 7.5 umol/L (ref 0.0–19.2)

## 2021-07-16 LAB — TSH: TSH: 2.64 u[IU]/mL (ref 0.450–4.500)

## 2021-07-19 ENCOUNTER — Telehealth: Payer: Self-pay | Admitting: Neurology

## 2021-07-19 NOTE — Telephone Encounter (Signed)
[employer] medicare no auth require, faxed to [facility], they will reach out to the patient to schedule.

## 2021-07-19 NOTE — Addendum Note (Signed)
Addended by: Naomie Dean B on: 07/19/2021 09:31 AM   Modules accepted: Orders

## 2021-07-28 ENCOUNTER — Encounter: Payer: Self-pay | Admitting: Psychology

## 2021-09-12 ENCOUNTER — Ambulatory Visit (INDEPENDENT_AMBULATORY_CARE_PROVIDER_SITE_OTHER): Payer: Medicare Other

## 2021-09-12 DIAGNOSIS — I442 Atrioventricular block, complete: Secondary | ICD-10-CM | POA: Diagnosis not present

## 2021-09-12 LAB — CUP PACEART REMOTE DEVICE CHECK
Battery Remaining Longevity: 52 mo
Battery Voltage: 2.99 V
Brady Statistic AP VP Percent: 32.35 %
Brady Statistic AP VS Percent: 0.03 %
Brady Statistic AS VP Percent: 67.59 %
Brady Statistic AS VS Percent: 0.04 %
Brady Statistic RA Percent Paced: 32.36 %
Brady Statistic RV Percent Paced: 99.91 %
Date Time Interrogation Session: 20221213111248
Implantable Lead Implant Date: 20171215
Implantable Lead Implant Date: 20171215
Implantable Lead Location: 753859
Implantable Lead Location: 753860
Implantable Lead Model: 5076
Implantable Lead Model: 5076
Implantable Pulse Generator Implant Date: 20171215
Lead Channel Impedance Value: 342 Ohm
Lead Channel Impedance Value: 361 Ohm
Lead Channel Impedance Value: 437 Ohm
Lead Channel Impedance Value: 456 Ohm
Lead Channel Pacing Threshold Amplitude: 0.625 V
Lead Channel Pacing Threshold Amplitude: 0.75 V
Lead Channel Pacing Threshold Pulse Width: 0.4 ms
Lead Channel Pacing Threshold Pulse Width: 0.4 ms
Lead Channel Sensing Intrinsic Amplitude: 16.25 mV
Lead Channel Sensing Intrinsic Amplitude: 16.25 mV
Lead Channel Sensing Intrinsic Amplitude: 2.25 mV
Lead Channel Sensing Intrinsic Amplitude: 2.25 mV
Lead Channel Setting Pacing Amplitude: 2 V
Lead Channel Setting Pacing Amplitude: 2.5 V
Lead Channel Setting Pacing Pulse Width: 0.4 ms
Lead Channel Setting Sensing Sensitivity: 2.8 mV

## 2021-09-21 NOTE — Progress Notes (Signed)
Remote pacemaker transmission.   

## 2021-09-26 ENCOUNTER — Other Ambulatory Visit: Payer: Self-pay

## 2021-09-26 ENCOUNTER — Ambulatory Visit (HOSPITAL_COMMUNITY)
Admission: RE | Admit: 2021-09-26 | Discharge: 2021-09-26 | Disposition: A | Discharge status: Home / Self Care | Payer: Medicare Other | Source: Ambulatory Visit | Attending: Neurology | Admitting: Neurology

## 2021-09-26 DIAGNOSIS — R410 Disorientation, unspecified: Secondary | ICD-10-CM | POA: Diagnosis present

## 2021-09-26 DIAGNOSIS — R413 Other amnesia: Secondary | ICD-10-CM | POA: Diagnosis not present

## 2021-09-26 DIAGNOSIS — R419 Unspecified symptoms and signs involving cognitive functions and awareness: Secondary | ICD-10-CM | POA: Insufficient documentation

## 2021-09-26 MED ORDER — GADOBUTROL 1 MMOL/ML IV SOLN
10.0000 mL | Freq: Once | INTRAVENOUS | Status: AC | PRN
  Administered 2021-09-26: 14:00:00 10 mL via INTRAVENOUS

## 2021-09-26 NOTE — Progress Notes (Signed)
Patient here today at [facility] for MRI brain w wo contrast. Patient has medtronic device. CLE sent. Orders received for DOO 85

## 2021-09-26 NOTE — Progress Notes (Signed)
Informed of MRI for today.   Device system confirmed to be MRI conditional, with implant date > 6 weeks ago, and no evidence of abandoned or epicardial leads in review of most recent CXR Interrogation from today reviewed, pt is currently AS-VP at 70 bpm Change device settings for MRI to DOO at 85 bpm  Tachy-therapies to off if applicable.  Program device back to pre-MRI settings after completion of exam.  Graciella Freer, PA-C  09/26/2021 1:04 PM

## 2021-10-09 ENCOUNTER — Ambulatory Visit: Payer: Medicare Other | Admitting: Neurology

## 2021-10-09 ENCOUNTER — Other Ambulatory Visit: Payer: Self-pay

## 2021-10-09 ENCOUNTER — Encounter: Payer: Self-pay | Admitting: Neurology

## 2021-10-09 VITALS — BP 176/85 | HR 67 | Ht 70.0 in | Wt 211.0 lb

## 2021-10-09 DIAGNOSIS — I771 Stricture of artery: Secondary | ICD-10-CM

## 2021-10-09 DIAGNOSIS — Z950 Presence of cardiac pacemaker: Secondary | ICD-10-CM | POA: Insufficient documentation

## 2021-10-09 DIAGNOSIS — S0285XA Fracture of orbit, unspecified, initial encounter for closed fracture: Secondary | ICD-10-CM | POA: Insufficient documentation

## 2021-10-09 DIAGNOSIS — I429 Cardiomyopathy, unspecified: Secondary | ICD-10-CM

## 2021-10-09 DIAGNOSIS — S0280XA Fracture of other specified skull and facial bones, unspecified side, initial encounter for closed fracture: Secondary | ICD-10-CM | POA: Insufficient documentation

## 2021-10-09 DIAGNOSIS — I442 Atrioventricular block, complete: Secondary | ICD-10-CM

## 2021-10-09 DIAGNOSIS — S0280XS Fracture of other specified skull and facial bones, unspecified side, sequela: Secondary | ICD-10-CM

## 2021-10-09 DIAGNOSIS — I447 Left bundle-branch block, unspecified: Secondary | ICD-10-CM | POA: Diagnosis not present

## 2021-10-09 DIAGNOSIS — R0683 Snoring: Secondary | ICD-10-CM | POA: Diagnosis not present

## 2021-10-09 DIAGNOSIS — G4719 Other hypersomnia: Secondary | ICD-10-CM | POA: Diagnosis not present

## 2021-10-09 DIAGNOSIS — S0285XS Fracture of orbit, unspecified, sequela: Secondary | ICD-10-CM

## 2021-10-09 NOTE — Patient Instructions (Signed)
Hypersomnia Hypersomnia is a condition in which a person feels very tired during the day even though he or she gets plenty of sleep at night. A person with this condition may take naps during the day and may find it very difficult to wake up from sleep. Hypersomnia may affect a person's ability to think, concentrate, drive, or remember things. What are the causes? The cause of this condition may not be known. Possible causes include: Certain medicines. Sleep disorders, such as narcolepsy and sleep apnea. Injury to the head, brain, or spinal cord. Drug or alcohol use. Gastroesophageal reflux disease (GERD). Tumors. Certain medical conditions, such as depression, diabetes, or an underactive thyroid gland (hypothyroidism). What are the signs or symptoms? The main symptoms of hypersomnia include: Feeling very tired throughout the day, regardless of how much sleep you got the night before. Having trouble waking up. Others may find it difficult to wake you up when you are sleeping. Sleeping for longer and longer periods at a time. Taking naps throughout the day. Other symptoms may include: Feeling restless, anxious, or annoyed. Lacking energy. Having trouble with: Remembering. Speaking. Thinking. Loss of appetite. Seeing, hearing, tasting, smelling, or feeling things that are not real (hallucinations). How is this diagnosed? This condition may be diagnosed based on: Your symptoms and medical history. Your sleeping habits. Your health care provider may ask you to write down your sleeping habits in a daily sleep log, along with any symptoms you have. A series of tests that are done while you sleep (sleep study or polysomnogram). A test that measures how quickly you can fall asleep during the day (daytime nap study or multiple sleep latency test). How is this treated? Treatment can help you manage your condition. Treatment may include: Following a regular sleep routine. Lifestyle changes,  such as changing your eating habits, getting regular exercise, and avoiding alcohol or caffeinated beverages. Taking medicines to make you more alert (stimulants) during the day. Treating any underlying medical causes of hypersomnia. Follow these instructions at home: Sleep routine  Schedule the same bedtime and wake-up time each day. Practice a relaxing bedtime routine. This may include reading, meditation, deep breathing, or taking a warm bath before going to sleep. Get regular exercise each day. Avoid strenuous exercise in the evening hours. Keep your sleep environment at a cooler temperature, darkened, and quiet. Sleep with pillows and a mattress that are comfortable and supportive. Schedule short 20-minute naps for when you feel sleepiest during the day. Talk with your employer or teachers about your hypersomnia. If possible, adjust your schedule so that: You have a regular daytime work schedule. You can take a scheduled nap during the day. You do not have to work or be active at night. Do not eat a heavy meal for a few hours before bedtime. Eat your meals at about the same times every day. Avoid drinking alcohol or caffeinated beverages. Safety  Do not drive or use heavy machinery if you are sleepy. Ask your health care provider if it is safe for you to drive. Wear a life jacket when swimming or spending time near water. General instructions Take supplements and over-the-counter and prescription medicines only as told by your health care provider. Keep a sleep log that will help your doctor manage your condition. This may include information about: What time you go to bed each night. How often you wake up at night. How many hours you sleep at night. How often and for how long you nap during the day.  Any observations from others, such as leg movements during sleep, sleep walking, or snoring. Keep all follow-up visits as told by your health care provider. This is important. Contact  a health care provider if: You have new symptoms. Your symptoms get worse. Get help right away if: You have serious thoughts about hurting yourself or someone else. If you ever feel like you may hurt yourself or others, or have thoughts about taking your own life, get help right away. You can go to your nearest emergency department or call: Your local emergency services (911 in the [place] A suicide crisis helpline, such as the National Suicide Prevention Lifeline at (314)162-5853 or 988 in the [place] This is open 24 hours a day. Summary Hypersomnia refers to a condition in which you feel very tired during the day even though you get plenty of sleep at night. A person with this condition may take naps during the day and may find it very difficult to wake up from sleep. Hypersomnia may affect a person's ability to think, concentrate, drive, or remember things. Treatment, such as following a regular sleep routine and making some lifestyle changes, can help you manage your condition. This information is not intended to replace advice given to you by your health care provider. Make sure you discuss any questions you have with your health care provider. Document Revised: 04/12/2021 Document Reviewed: 07/28/2020 Elsevier Patient Education  2022 Elsevier Inc. Sleep Apnea Sleep apnea affects breathing during sleep. It causes breathing to stop for 10 seconds or more, or to become shallow. People with sleep apnea usually snore loudly. It can also increase the risk of: Heart attack. Stroke. Being very overweight (obese). Diabetes. Heart failure. Irregular heartbeat. High blood pressure. The goal of treatment is to help you breathe normally again. What are the causes? The most common cause of this condition is a collapsed or blocked airway. There are three kinds of sleep apnea: Obstructive sleep apnea. This is caused by a blocked or collapsed airway. Central sleep apnea. This happens when the brain  does not send the right signals to the muscles that control breathing. Mixed sleep apnea. This is a combination of obstructive and central sleep apnea. What increases the risk? Being overweight. Smoking. Having a small airway. Being older. Being male. Drinking alcohol. Taking medicines to calm yourself (sedatives or tranquilizers). Having family members with the condition. Having a tongue or tonsils that are larger than normal. What are the signs or symptoms? Trouble staying asleep. Loud snoring. Headaches in the morning. Waking up gasping. Dry mouth or sore throat in the morning. Being sleepy or tired during the day. If you are sleepy or tired during the day, you may also: Not be able to focus your mind (concentrate). Forget things. Get angry a lot and have mood swings. Feel sad (depressed). Have changes in your personality. Have less interest in sex, if you are male. Be unable to have an erection, if you are male. How is this treated?  Sleeping on your side. Using a medicine to get rid of mucus in your nose (decongestant). Avoiding the use of alcohol, medicines to help you relax, or certain pain medicines (narcotics). Losing weight, if needed. Changing your diet. Quitting smoking. Using a machine to open your airway while you sleep, such as: An oral appliance. This is a mouthpiece that shifts your lower jaw forward. A CPAP device. This device blows air through a mask when you breathe out (exhale). An EPAP device. This has valves that you  put in each nostril. A BIPAP device. This device blows air through a mask when you breathe in (inhale) and breathe out. Having surgery if other treatments do not work. Follow these instructions at home: Lifestyle Make changes that your doctor recommends. Eat a healthy diet. Lose weight if needed. Avoid alcohol, medicines to help you relax, and some pain medicines. Do not smoke or use any products that contain nicotine or tobacco. If  you need help quitting, ask your doctor. General instructions Take over-the-counter and prescription medicines only as told by your doctor. If you were given a machine to use while you sleep, use it only as told by your doctor. If you are having surgery, make sure to tell your doctor you have sleep apnea. You may need to bring your device with you. Keep all follow-up visits. Contact a doctor if: The machine that you were given to use during sleep bothers you or does not seem to be working. You do not get better. You get worse. Get help right away if: Your chest hurts. You have trouble breathing in enough air. You have an uncomfortable feeling in your back, arms, or stomach. You have trouble talking. One side of your body feels weak. A part of your face is hanging down. These symptoms may be an emergency. Get help right away. Call your local emergency services (911 in the [place] Do not wait to see if the symptoms will go away. Do not drive yourself to the hospital. Summary This condition affects breathing during sleep. The most common cause is a collapsed or blocked airway. The goal of treatment is to help you breathe normally while you sleep. This information is not intended to replace advice given to you by your health care provider. Make sure you discuss any questions you have with your health care provider. Document Revised: 04/26/2021 Document Reviewed: 08/26/2020 Elsevier Patient Education  2022 [employer].

## 2021-10-09 NOTE — Progress Notes (Signed)
SLEEP MEDICINE CLINIC    Provider:  Melvyn Novas, MD  Primary Care Physician:  Marvin Prince, MD [address] [place] [address]     Referring Provider: Anson Fret, Md [address] [place],  [place] [address]          Chief Complaint according to patient   Patient presents with:     New Patient (Initial Visit)     Rm 10Rm 10, w wife Marvin Padilla. Pt was referred by Dr. Lucia Padilla for excessive daytime sleepiness.. Pt was referred by Dr. Lucia Padilla for excessive daytime sleepiness. Had No prior SS.  Pt can doze off for 15-49min in the evening. He does snore loudly. No trouble with falling or staying asleep. Would like reports shared with PCP.  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 He had fainted I front of Dr. Ladona Padilla. Had no atrial fib, regular bradycardia with BBB.       HISTORY OF PRESENT ILLNESS:  Marvin Padilla is a 81 y.o. patient seen here upon referral on 10/09/2021 from Dr Marvin Gaskins, MD.  Chief concern according to patient :  I fal asleep easily and have been snoring.     Marvin Padilla  has a past medical history of DM, Generalized arthritis, High cholesterol, History of left bundle branch block (LBBB) (05/01/2015), Hypertension, Pacemaker 2019, and TBI (traumatic brain injury) (1964).    Sleep relevant medical history: Orbital fracture in 1964, nasal surgery, facial reconstruction, tonsillectomy age 77- Had deviated septum repair.   Family medical /sleep history: NO other family member on CPAP with OSA, insomnia, sleep walkers.    Social history: Originally form [place], this pleasant male patient  lives in [place] since 1973, Patient is retired from [employer] as a Designer, multimedia and lives in a household with his spouse and a grandson, who just started Copywriter, advertising.   2 adult children and several grandchildren.the couple  owns a dog.  The patient used to work in shifts( night/ rotating,)  Tobacco use-never .  ETOH use: none ,  Caffeine intake in form of Coffee( 2-3) . Regular exercise in form of walking ant at the [facility]. Water exercises. .     Sleep habits are as follows: The patient's dinner time is between 5-6.30 PM. The patient goes to bed at 8.30-10 PM and continues to sleep for 3-5 hours, wakes for one bathroom breaks, and sleeps again 4 hours.   The preferred sleep position is sideways with the support of 1-2 pillows. He snores in all positions, his wife confirmed. Even in his chair.  Dreams are reportedly infrequent/vivid.  5  AM is the usual rise time. The patient wakes up spontaneously. He reports usually feeling refreshed or restored in AM, with symptoms such as dry mouth and residual fatigue. Naps are taken infrequently, lasting from 15-30 t minutes and are refreshing.    Review of Systems: Out of a complete 14 system review, the patient complains of only the following symptoms, and all other reviewed systems are negative.:  Fatigue, sleepiness , snoring,    How likely are you to doze in the following situations: 0 = not likely, 1 = slight chance, 2 = moderate chance, 3 = high chance   Sitting and Reading? Watching Television? Sitting inactive in a public place (theater or meeting)? As a passenger in a car  for an hour without a break? Lying down in the afternoon when circumstances permit? Sitting and talking to someone? Sitting quietly after lunch without alcohol? In a car, while stopped for a few minutes in traffic?   Total = 13/ 24 points   FSS endorsed at 35/ 63 points.   Social History   Socioeconomic History   Marital status: Married    Spouse name: Marvin Padilla   Number of children: 2   Years of education: Not on file   Highest education level: Associate degree: academic program  Occupational History   Not on file  Tobacco Use   Smoking status: Never   Smokeless tobacco: Never   Vaping Use   Vaping Use: Never used  Substance and Sexual Activity   Alcohol use: No   Drug use: No   Sexual activity: Not on file  Other Topics Concern   Not on file  Social History Narrative   Lives at home with wife and grandson   Right handed   Caffeine: 1/2 and 1/2 caffeine 2-3 cups in morning, maybe additional cup of regular in the afternoon   Social Determinants of Health   Financial Resource Strain: Not on file  Food Insecurity: Not on file  Transportation Needs: Not on file  Physical Activity: Not on file  Stress: Not on file  Social Connections: Not on file    Family History  Problem Relation Age of Onset   Heart failure Mother    Heart failure Father    Lung cancer Maternal Grandfather    Cancer Sister     Past Medical History:  Diagnosis Date   Generalized arthritis    High cholesterol    History of left bundle branch block (LBBB) 05/01/2015   Hypertension    Pacemaker    a. 09/14/16: CHB s/p PPM: Medtronic (serial number [id number]) pacemaker   TBI (traumatic brain injury) 1964   unconscious for two weeks    Past Surgical History:  Procedure Laterality Date   BACK SURGERY     EP IMPLANTABLE DEVICE N/A 09/14/2016   Procedure: Pacemaker Implant;  Surgeon: Marvin Maw, MD;  Location: [facility];  Service: Cardiovascular;  Laterality: N/A;   NOSE SURGERY     PACEMAKER IMPLANT  09/14/2016     Current Outpatient Medications on File Prior to Visit  Medication Sig Dispense Refill   ACCU-CHEK GUIDE test strip      amLODipine (NORVASC) 5 MG tablet Take 1 tablet by mouth daily.     Blood Glucose Calibration (ACCU-CHEK GUIDE CONTROL) LIQD      Clotrimazole (LOTRIMIN AF EX) Apply topically as needed.     diphenhydramine-acetaminophen (TYLENOL PM) 25-500 MG TABS tablet Take 1 tablet by mouth at bedtime.     docusate sodium (COLACE) 100 MG capsule Take 100 mg by mouth daily.     finasteride (PROSCAR) 5 MG tablet Take 5 mg by mouth daily.      glimepiride (AMARYL) 2 MG tablet Take 2 mg by mouth daily with breakfast.     lisinopril-hydrochlorothiazide (PRINZIDE,ZESTORETIC) 20-25 MG tablet Take 1 tablet by mouth daily.     metFORMIN (GLUCOPHAGE) 500 MG tablet Take 500 mg by mouth 2 (two) times daily with a meal.     Multiple Vitamins-Minerals (MENS MULTIVITAMIN) TABS Take 1 tablet by mouth daily.     simvastatin (ZOCOR) 40 MG tablet Take 40 mg by mouth daily.  4   tamsulosin (FLOMAX) 0.4 MG CAPS capsule Take 0.4 mg by mouth  daily.     terbinafine (LAMISIL) 250 MG tablet Take 250 mg by mouth daily.     No current facility-administered medications on file prior to visit.    Allergies  Allergen Reactions   Poison Ivy Extract     Physical exam:  Today's Vitals   10/09/21 1253  BP: (!) 176/85  Pulse: 67  Weight: 211 lb (95.7 kg)  Height: 5\' 10"  (1.778 m)   Body mass index is 30.28 kg/m.   Wt Readings from Last 3 Encounters:  10/09/21 211 lb (95.7 kg)  07/12/21 210 lb 6.4 oz (95.4 kg)  02/02/21 213 lb 12.8 oz (97 kg)     Ht Readings from Last 3 Encounters:  10/09/21 5\' 10"  (1.778 m)  07/12/21 5\' 10"  (1.778 m)  02/02/21 5\' 10"  (1.778 m)      General: The patient is awake, alert and appears not in acute distress. The patient is well groomed. Head: Normocephalic, atraumatic. Neck is supple.  Mallampati ,  neck circumference:16.5 inches . Nasal airflow not fully patent.  Retrognathia is not seen. He had midfacial and orbital fracture. Dental status: biological teeth.  Cardiovascular:  Regular rate and cardiac rhythm by pulse,  without distended neck veins. Respiratory: Lungs are clear to auscultation.  Skin:  Without evidence of ankle edema, or rash. Trunk: The patient's posture is erect.   Neurologic exam : The patient is awake and alert, oriented to place and time.   Memory subjective described as intact.  Attention span & concentration ability appears normal.  Speech is fluent,  without  dysarthria, dysphonia  or aphasia.  Mood and affect are appropriate.   Cranial nerves: no loss of smell or taste reported  Pupils are equal and briskly reactive to light. Funduscopic exam deferred. .  Extraocular movements in vertical and horizontal planes were intact and without nystagmus. No Diplopia. Visual fields by finger perimetry are intact. Hearing was intact to tuning fork.   Facial sensation intact to fine touch.  Facial motor strength is symmetric and tongue and uvula move midline.  Neck ROM : rotation, tilt and flexion extension were normal for age and shoulder shrug was symmetrical.    Motor exam:  Symmetric bulk, tone and ROM.   Normal tone without cog- wheeling, symmetric grip strength .   Sensory:  Fine touch, pinprick and vibration were  normal.  Proprioception tested in the upper extremities was normal.   Coordination: Rapid alternating movements in the fingers/hands were of normal speed.  The Finger-to-nose maneuver was intact without evidence of ataxia, dysmetria or tremor.   Gait and station: Patient could rise unassisted from a seated position, walked without assistive device.  Stance is of normal width/ base and the patient turned with 3 steps.  Toe and heel walk were deferred.  Deep tendon reflexes: in the  upper and lower extremities are symmetrically attenuated. Babinski response was deferred.      After spending a total time of 45 minutes face to face and additional time for physical and neurologic examination, review of laboratory studies,  personal review of imaging studies, reports and results of other testing and review of referral information / records as far as provided in visit, I have established the following assessments:  1)  traumatic facial injuries a have affected breathing, nasal pathways and oral cavity. 2)  snorer in all sleep positions. 3) EDS , excessive daytime sleepiness.    My Plan is to proceed with:  1) attended sleep study- but Marvin Padilla will  not likely allow  it. HST will be ordered as alternative.  2) the described elevated level of daytime sleepiness may well be related to sleep apnea given the anatomy of the upper airway.  Also has history of a bradycardia with the need for a cardiac pacemaker may indicate that he had previous hypoxic episodes.  He had a history of a severe head injury and Dr. Daisy Padilla has evaluated him or is in the process of evaluation for memory loss.  It is very valid to evaluated by sleep study.   I would like to thank Marvin PrinceSouth, Stephen, MD and Marvin FretAhern, Antonia B, Md [address] [place],  [place] [id number] for allowing me to meet with and to take care of this pleasant patient.   In short, Marvin Padilla is presenting with loud snoring and EDS, a symptom that can be attributed to OSA. He denied depression.   I plan to follow up either personally or through our NP within 2-4 months.   CC: I will share my notes with PCP and Dr Marvin GaskinsAhern, Dr Dione BoozeGroat. .  Electronically signed by: Melvyn Novasarmen Alyxis Grippi, MD 10/09/2021 1:10 PM  [facility] certified by The [employer] of [employer] Medicine and Diplomate of the [employer] of [employer] Medicine. Board certified In Neurology through the ABPN, Fellow of the [employer] of Neu[employer]. Medical Director of [facility].

## 2021-10-29 ENCOUNTER — Ambulatory Visit (INDEPENDENT_AMBULATORY_CARE_PROVIDER_SITE_OTHER): Payer: Medicare Other | Admitting: Neurology

## 2021-10-29 ENCOUNTER — Other Ambulatory Visit: Payer: Self-pay

## 2021-10-29 DIAGNOSIS — S0280XS Fracture of other specified skull and facial bones, unspecified side, sequela: Secondary | ICD-10-CM

## 2021-10-29 DIAGNOSIS — R0683 Snoring: Secondary | ICD-10-CM

## 2021-10-29 DIAGNOSIS — G4733 Obstructive sleep apnea (adult) (pediatric): Secondary | ICD-10-CM | POA: Diagnosis not present

## 2021-10-29 DIAGNOSIS — I771 Stricture of artery: Secondary | ICD-10-CM

## 2021-10-29 DIAGNOSIS — I447 Left bundle-branch block, unspecified: Secondary | ICD-10-CM

## 2021-10-29 DIAGNOSIS — I442 Atrioventricular block, complete: Secondary | ICD-10-CM

## 2021-10-29 DIAGNOSIS — I429 Cardiomyopathy, unspecified: Secondary | ICD-10-CM

## 2021-10-29 DIAGNOSIS — Z950 Presence of cardiac pacemaker: Secondary | ICD-10-CM

## 2021-10-29 DIAGNOSIS — G4719 Other hypersomnia: Secondary | ICD-10-CM

## 2021-11-10 ENCOUNTER — Encounter: Payer: Self-pay | Admitting: Neurology

## 2021-11-17 NOTE — Addendum Note (Signed)
Addended by: Melvyn Novas on: 11/17/2021 12:33 PM   Modules accepted: Orders

## 2021-11-17 NOTE — Procedures (Signed)
PATIENT'S NAME:  Marvin Padilla, Marvin Padilla DOB:      08-Feb-1941      MR#:    [id number]     DATE OF RECORDING: 10/29/2021, Richard Miu.  REFERRING M.D.:  Sarina Ill MD Study Performed:   Baseline Polysomnogram HISTORY:  MARTICE HELLUMS is a primary patient of Dr Reynold Bowen, MD, and  has a medical history of DM, Generalized arthritis, High cholesterol, History of left bundle branch block (LBBB) (05/01/2015), Hypertension, implanted cardiac Pacemaker 2019, and TBI (traumatic brain injury) (1964). Orbital fracture in 1964, nasal surgery, mid face fracture- facial reconstruction, and tonsillectomy age 81- Had deviated septum repair. 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 Dr. Jaynee Eagles referred for sleep study. Excessive daytime sleepiness and fragmented sleep.   The patient endorsed the Epworth Sleepiness Scale at 13/14 points.  FSS at 35 points. Loud Snoring reported in all sleep positions-by spouse.  The patient's weight 211 pounds with a height of 70 (inches), resulting in a BMI of 30.3 kg/m2. The patient's neck circumference measured 16.5 inches.  CURRENT MEDICATIONS: Norvasc, Lotrimin AF EX, Tylenol PM, Colace, Proscar, Amaryl, Prinzide, Glucophage, Multivitamin, Zocor, Flomax, Lamisil   PROCEDURE:  This is a multichannel digital polysomnogram utilizing the Somnostar 11.2 system.  Electrodes and sensors were applied and monitored per AASM Specifications.   EEG, EOG, Chin and Limb EMG, were sampled at 200 Hz.  ECG, Snore and Nasal Pressure, Thermal Airflow, Respiratory Effort, CPAP Flow and Pressure, Oximetry was sampled at 50 Hz. Digital video and audio were recorded.      BASELINE STUDY: Lights Out was at 21:45 and Lights On at 05:17.  Total recording time (TRT) was 452.5 minutes, with a total sleep time (TST) of 350 minutes.   The patient's sleep latency was 33.5 minutes.  REM latency was 65.5 minutes.  The sleep efficiency was 77.3 %.     SLEEP ARCHITECTURE: WASO (Wake after sleep onset) was 69  minutes.  There were 30.5 minutes in Stage N1, 215.5 minutes Stage N2, 41 minutes Stage N3 and 63 minutes in Stage REM.  The percentage of Stage N1 was 8.7%, Stage N2 was 61.6%, Stage N3 was 11.7% and Stage R (REM sleep) was 18.%.   RESPIRATORY ANALYSIS:  There were a total of 149 respiratory events:  19 obstructive apneas, 130 hypopneas. The patient also had several respiratory event related arousals (RERAs).      The total APNEA/HYPOPNEA INDEX (AHI) was 25.5/hour and the total RESPIRATORY DISTURBANCE INDEX was 29.5 /hour.   34 events occurred in REM sleep and 208 events in NREM.  The REM AHI was 32.4 /hour, versus a non-REM AHI of 24.0/h.  The patient spent 248 minutes of total sleep time in the supine position and 102 minutes in non-supine. The supine AHI was 31.9 versus a non-supine AHI of 10.0/h.  OXYGEN SATURATION & C02:  The Wake baseline 02 saturation was 96%, with the lowest being 83%. Time spent below 89% saturation equaled 15 minutes.  The arousals were noted as: 40 were spontaneous, 4 were associated with PLMs, 46 were associated with respiratory events.  The patient had a total of 71 Periodic Limb Movements.  The Periodic Limb Movement (PLM) Arousal index was 0.7/hour.  Audio and video analysis did not show any phonations or vocalizations.  Sleep appeared fragmented and most fragmented during REM sleep. Hypoxia clustered in REM sleep as well. No PLMs in REM sleep/. Snoring was noted, moderate- loud, loudest in supine. EKG was paced.   IMPRESSION:  Moderate- severe Obstructive Sleep Apnea (OSA) with hypoxia in REM sleep. REM accentuated and positionally dependent apnea 9 supine sleep)   Mild Periodic Limb Movement Disorder (PLMD) Primary Snoring Paced EKG   RECOMMENDATIONS:  Advise full night, attended, CPAP titration study to optimize therapy.  If this is not permitted by insurance, will use PLAN B and use an autotitration device with a setting from 6 through 16 cm water,  3 cm EPR and heated humidification, mask to be fitted (midface fracture patient who needs to avoid supine sleep!)     I certify that I have reviewed the entire raw data recording prior to the issuance of this report in accordance with the Standards of Accreditation of the American Academy of Sleep Medicine (AASM)  Larey Seat, MD Diplomat, American Board of Psychiatry and Neurology  Diplomat, American Board of Sleep Medicine Market researcher, [place] [facility]

## 2021-11-17 NOTE — Progress Notes (Signed)
Patient did not qualify for a SPLIT protocol, but clearly has OSA sleep apnea, REM sleep is fragmented, hypoxia seen in REM sleep, snoring in all position-but is loudest in supine.   Main treatment is to avoid supine sleep and CPAP, for which I want this patient to return. If not possible will use an autotitration device.

## 2021-11-20 ENCOUNTER — Telehealth: Payer: Self-pay

## 2021-11-20 NOTE — Telephone Encounter (Signed)
Dr. Jaynee Eagles and Dr. Brett Fairy both have comments for this patient based on his results. See both.  I called patient to discuss. No answer, left a message asking him to call me back. If patient calls back another day please send call to POD 1.

## 2021-11-20 NOTE — Telephone Encounter (Signed)
I called patient to discuss. No answer, left a message asking him to call me back. If patient calls back another day please send call to POD 1.

## 2021-11-20 NOTE — Telephone Encounter (Signed)
-----   Message from Larey Seat, MD sent at 11/17/2021 12:32 PM EST ----- Patient did not qualify for a SPLIT protocol, but clearly has OSA sleep apnea, REM sleep is fragmented, hypoxia seen in REM sleep, snoring in all position-but is loudest in supine.   Main treatment is to avoid supine sleep and CPAP, for which I want this patient to return. If not possible will use an autotitration device.

## 2021-11-20 NOTE — Telephone Encounter (Signed)
-----   Message from Melvenia Beam, MD sent at 11/18/2021  4:54 PM EST ----- Pod 1 or Pod 4: When you give him his results, make sure you tell him no driving until his OSA is treated AND his full evaluation is complete (I am sending him to formal memory testing). I saw him for chief complaint of "passing out at the wheel" and it sounded to me like he was actually falling asleep which may be true since he clearly has OSA and extreme fatigue. Also some questions on cognitive status. No driving until cleared by neurology or dr Forde Dandy. thanks  ----- Message ----- From: Larey Seat, MD Sent: 11/17/2021  12:32 PM EST To: Reynold Bowen, MD, Melvenia Beam, MD, #  Patient did not qualify for a SPLIT protocol, but clearly has OSA sleep apnea, REM sleep is fragmented, hypoxia seen in REM sleep, snoring in all position-but is loudest in supine.   Main treatment is to avoid supine sleep and CPAP, for which I want this patient to return. If not possible will use an autotitration device.

## 2021-11-21 ENCOUNTER — Encounter: Payer: Self-pay | Admitting: Neurology

## 2021-11-23 NOTE — Telephone Encounter (Signed)
Spoke to patient about Sleep study results Per Dr.Ahern Patient is not to drive until sleep apnea is treated and Dr Lucia Gaskins or Dr.South clears him to drive. Pt states he understands and he has a second sleep study This coming Monday.Patient thanked me for calling

## 2021-11-23 NOTE — Telephone Encounter (Signed)
-----   Message from Antonia B Ahern, MD sent at 11/18/2021  4:54 PM EST ----- °Pod 1 or Pod 4: When you give him his results, make sure you tell him no driving until his OSA is treated AND his full evaluation is complete (I am sending him to formal memory testing). I saw him for chief complaint of "passing out at the wheel" and it sounded to me like he was actually falling asleep which may be true since he clearly has OSA and extreme fatigue. Also some questions on cognitive status. No driving until cleared by neurology or dr south. thanks ° °----- Message ----- °From: Dohmeier, Carmen, MD °Sent: 11/17/2021  12:32 PM EST °To: Stephen South, MD, Antonia B Ahern, MD, # ° °Patient did not qualify for a SPLIT protocol, but clearly has OSA sleep apnea, REM sleep is fragmented, hypoxia seen in REM sleep, snoring in all position-but is loudest in supine.   °Main treatment is to avoid supine sleep and CPAP, for which I want this patient to return. If not possible will use an autotitration device.  ° °

## 2021-11-27 ENCOUNTER — Ambulatory Visit (INDEPENDENT_AMBULATORY_CARE_PROVIDER_SITE_OTHER): Payer: Medicare Other | Admitting: Neurology

## 2021-11-27 ENCOUNTER — Other Ambulatory Visit: Payer: Self-pay

## 2021-11-27 DIAGNOSIS — G4719 Other hypersomnia: Secondary | ICD-10-CM

## 2021-11-27 DIAGNOSIS — G4733 Obstructive sleep apnea (adult) (pediatric): Secondary | ICD-10-CM

## 2021-11-27 DIAGNOSIS — I429 Cardiomyopathy, unspecified: Secondary | ICD-10-CM

## 2021-11-27 DIAGNOSIS — I771 Stricture of artery: Secondary | ICD-10-CM

## 2021-11-27 DIAGNOSIS — Z950 Presence of cardiac pacemaker: Secondary | ICD-10-CM

## 2021-11-27 DIAGNOSIS — I442 Atrioventricular block, complete: Secondary | ICD-10-CM

## 2021-11-27 DIAGNOSIS — S0280XS Fracture of other specified skull and facial bones, unspecified side, sequela: Secondary | ICD-10-CM

## 2021-12-01 DIAGNOSIS — G4733 Obstructive sleep apnea (adult) (pediatric): Secondary | ICD-10-CM | POA: Insufficient documentation

## 2021-12-01 NOTE — Progress Notes (Signed)
CPAP at 15 cm water eliminated all apnea and PLMs, as long as patient remains in a non-supine position. New mask fit allowed for lateral sleep position.  Most important recommendation - avoid supine sleep position!.  Your new CPAP can be set at 15 cm water pressure, ramp from 5 cm water, with heated humidification and an EVORA mask in small- medium size.  1. Any apnea patient should avoid sedatives, hypnotics, and alcohol consumption at bedtime. 2. CPAP therapy compliance is defined as a minimum of 4 hours of nightly use.

## 2021-12-01 NOTE — Procedures (Signed)
PATIENT'S NAME:  Marvin, Padilla DOB:      1941-04-22      MR#:    [id number]     DATE OF RECORDING: 11/27/2021 Gerre Pebbles REFERRING M.D.:  Sarina Ill MD Study Performed:   CPAP  Titration HISTORY:   Marvin Padilla is a primary patient of Dr Reynold Bowen, MD, has a medical history of DM, Poli-arthritis, High cholesterol, History of left bundle branch block  (LBBB) (05/01/2015), Hypertension, implanted cardiac Pacemaker  2019, and TBI (traumatic brain injury) (1964). Orbital fracture in 1964, nasal surgery, mid face fracture- facial reconstruction, and tonsillectomy age 10- Had deviated septum repair. Now complete  heart block and memory loss. Dr. Jaynee Eagles referred for sleep study.  Excessive daytime sleepiness and fragmented sleep. He returned for a CPAP titration study following this PSG from 10-29-2021:  IMPRESSION:  Moderate- severe Obstructive Sleep Apnea (OSA) AHI 25.5/h with hypoxia in REM sleep. REM accentuated and positionally dependent apnea in supine sleep.   Mild Periodic Limb Movement Disorder (PLMD) Primary Snoring Paced EKG RECOMMENDATIONS:  Advise full night, attended, CPAP titration study to optimize therapy.  Mask to be fitted (midface trauma patient who needs to avoid supine sleep!)    The patient endorsed the Epworth Sleepiness Scale at 13/24 points.   The patient's weight 211 pounds with a height of 70 (inches), resulting in a BMI of 30.3 kg/m2. The patient's neck circumference measured 16.5 inches.  CURRENT MEDICATIONS: Norvasc, Lotrimin AF EX, Tylenol PM, Colace, Proscar, Amaryl, Prinzide, Glucophage, Multivitamin, Zocor, Flomax, Lamisil  PROCEDURE:  This is a multichannel digital polysomnogram utilizing the SomnoStar 11.2 system.  Electrodes and sensors were applied and monitored per AASM Specifications.   EEG, EOG, Chin and Limb EMG, were sampled at 200 Hz.  ECG, Snore and Nasal Pressure, Thermal Airflow, Respiratory Effort, CPAP Flow and Pressure, Oximetry was sampled at  50 Hz. Digital video and audio were recorded.       CPAP was initiated under a Simplus FFM in medium but changed to a F and P Evora small-medium by 3 AM . titration pressure started at 5 cmH20 with heated humidity per AASM standards and pressure was advanced to 15cmH20 because of hypopneas, apneas and desaturations.  At a PAP pressure of 15 cmH20, there was a reduction of the AHI to 1.6 with improvement of sleep apnea, hypoxia and AHI was 0.0 when sleeping non-supine.  Lights Out was at 20:42 and Lights On at 05:11. Total recording time (TRT) was 510 minutes, with a total sleep time (TST) of 414 minutes. The patient's sleep latency was 10.5 minutes. REM latency was 92.5 minutes.  The sleep efficiency was 81.2 %.    SLEEP ARCHITECTURE: WASO (Wake after sleep onset) was 85.5 minutes.  There were 41 minutes in Stage N1, 175 minutes Stage N2, 130 minutes Stage N3 and 68 minutes in Stage REM.  The percentage of Stage N1 was 9.9%, Stage N2 was 42.3%, Stage N3 was 31.4% and Stage R (REM sleep) was 16.4%.   RESPIRATORY ANALYSIS:  There was a total of 46 respiratory events: 10 obstructive apneas, 0 central apneas and 5 mixed apneas with 31 hypopneas.     The total APNEA/HYPOPNEA INDEX (AHI) was 6.7 /h.  1 event occurred in REM sleep and 45 events in NREM. The REM AHI was 0.9 /hour versus a non-REM AHI of 7.8 /hour.   The patient spent 114.5 minutes of total sleep time in the supine position and 300 minutes in non-supine.  The supine AHI was 23.6/h, versus a non-supine AHI of 0.2.  OXYGEN SATURATION & C02:  The baseline 02 saturation was 94%, with the lowest being 86%. Time spent below 89% saturation equaled 5 minutes.  PERIODIC LIMB MOVEMENTS:  The patient had a total of 95 Periodic Limb Movements. The Periodic Limb Movement (PLM) Arousal index was 0 .6 /hour.  PLMs clustered in supine sleep! Audio and video analysis did not show any abnormal or unusual movements, behaviors, phonations or vocalizations.    Mild snoring was recorded.  EKG was in paced rhythm.  DIAGNOSIS Obstructive Sleep Apnea, responding well to 15 cm water CPAP, residual apneas and hypopneas were all supine positional dependent.  Mild Periodic Limb Movement Disorder, with supine positional dependency. PLMs may have been worse due to Tylenol with Benadryl intake (anticholinergic) .   paced EKG  PLANS/RECOMMENDATIONS: Most important recommendation - avoid supine sleep position!.  Your new CPAP can be set at 15 cm water pressure, ramp from 5 cm water, with heated humidification and an EVORA mask in small- medium size.  Any apnea patient should avoid sedatives, hypnotics, and alcohol consumption at bedtime. CPAP therapy compliance is defined as a minimum of 4 hours of nightly use.  DISCUSSION: A follow up appointment will be scheduled in the Sleep Clinic at [facility].  Please call [phone] with any questions.      I certify that I have reviewed the entire raw data recording prior to the issuance of this report in accordance with the Standards of Accreditation of the American Academy of Sleep Medicine (AASM)  Larey Seat, M.D. Diplomat, Tax adviser of Psychiatry and Neurology

## 2021-12-01 NOTE — Addendum Note (Signed)
Addended by: Melvyn Novas on: 12/01/2021 08:43 AM   Modules accepted: Orders

## 2021-12-12 ENCOUNTER — Encounter: Payer: Self-pay | Admitting: Neurology

## 2021-12-12 ENCOUNTER — Ambulatory Visit (INDEPENDENT_AMBULATORY_CARE_PROVIDER_SITE_OTHER): Payer: Medicare Other

## 2021-12-12 DIAGNOSIS — I442 Atrioventricular block, complete: Secondary | ICD-10-CM | POA: Diagnosis not present

## 2021-12-12 LAB — CUP PACEART REMOTE DEVICE CHECK
Battery Remaining Longevity: 53 mo
Battery Voltage: 2.99 V
Brady Statistic AP VP Percent: 17.94 %
Brady Statistic AP VS Percent: 0 %
Brady Statistic AS VP Percent: 81.98 %
Brady Statistic AS VS Percent: 0.07 %
Brady Statistic RA Percent Paced: 17.94 %
Brady Statistic RV Percent Paced: 99.93 %
Date Time Interrogation Session: 20230314091146
Implantable Lead Implant Date: 20171215
Implantable Lead Implant Date: 20171215
Implantable Lead Location: 753859
Implantable Lead Location: 753860
Implantable Lead Model: 5076
Implantable Lead Model: 5076
Implantable Pulse Generator Implant Date: 20171215
Lead Channel Impedance Value: 342 Ohm
Lead Channel Impedance Value: 380 Ohm
Lead Channel Impedance Value: 418 Ohm
Lead Channel Impedance Value: 456 Ohm
Lead Channel Pacing Threshold Amplitude: 0.625 V
Lead Channel Pacing Threshold Amplitude: 0.75 V
Lead Channel Pacing Threshold Pulse Width: 0.4 ms
Lead Channel Pacing Threshold Pulse Width: 0.4 ms
Lead Channel Sensing Intrinsic Amplitude: 11.25 mV
Lead Channel Sensing Intrinsic Amplitude: 11.25 mV
Lead Channel Sensing Intrinsic Amplitude: 2.625 mV
Lead Channel Sensing Intrinsic Amplitude: 2.625 mV
Lead Channel Setting Pacing Amplitude: 2 V
Lead Channel Setting Pacing Amplitude: 2.5 V
Lead Channel Setting Pacing Pulse Width: 0.4 ms
Lead Channel Setting Sensing Sensitivity: 2.8 mV

## 2021-12-26 ENCOUNTER — Telehealth: Payer: Self-pay | Admitting: Neurology

## 2021-12-26 NOTE — Telephone Encounter (Signed)
Pt was scheduled for his initial CPAP on 01-31-22 ?Pt was informed to bring machine and power cord to the appointment ?DME: [facility] ?Phone: [phone], press option 1 ?Fax: [phone] ?  ?Set up on 12/22/21 w an AirSense 10 auto  ?F/u: 01/23/22-6/22-23 ? ?

## 2021-12-26 NOTE — Progress Notes (Signed)
Remote pacemaker transmission.   

## 2022-01-30 NOTE — Patient Instructions (Addendum)
Please continue using your CPAP regularly. While your insurance requires that you use CPAP at least 4 hours each night on 70% of the nights, I recommend, that you not skip any nights and use it throughout the night if you can. Getting used to CPAP and staying with the treatment long term does take time and patience and discipline. Untreated obstructive sleep apnea when it is moderate to severe can have an adverse impact on cardiovascular health and raise her risk for heart disease, arrhythmias, hypertension, congestive heart failure, stroke and diabetes. Untreated obstructive sleep apnea causes sleep disruption, nonrestorative sleep, and sleep deprivation. This can have an impact on your day to day functioning and cause daytime sleepiness and impairment of cognitive function, memory loss, mood disturbance, and problems focussing. Using CPAP regularly can improve these symptoms. ? ?Please continue to monitor usage of CPAP at home. If you are certain that time is not being recorded correctly please continue to discuss with [facility]. Please make sure you are turning the machine back on after going to the restroom. You have an appt with Dr Sima Matas 5/10. He will send evaluation to Dr Jaynee Eagles once complete. She will guide you on what to do next.  ? ?Follow up with me in 6 months for CPAP review.  ? ? ?Management of Memory Problems ?  ?There are some general things you can do to help manage your memory problems.  Your memory may not in fact recover, but by using techniques and strategies you will be able to manage your memory difficulties better. ?  ?1)  Establish a routine. ?Try to establish and then stick to a regular routine.  By doing this, you will get used to what to expect and you will reduce the need to rely on your memory.  Also, try to do things at the same time of day, such as taking your medication or checking your calendar first thing in the morning. ?Think about think that you can do as a part of a regular  routine and make a list.  Then enter them into a daily planner to remind you.  This will help you establish a routine. ?  ?2)  Organize your environment. ?Organize your environment so that it is uncluttered.  Decrease visual stimulation.  Place everyday items such as keys or cell phone in the same place every day (ie.  Basket next to front door) ?Use post it notes with a brief message to yourself (ie. Turn off light, lock the door) ?Use labels to indicate where things go (ie. Which cupboards are for food, dishes, etc.) ?Keep a notepad and pen by the telephone to take messages ?  ?3)  Memory Aids ?A diary or journal/notebook/daily planner ?Making a list (shopping list, chore list, to do list that needs to be done) ?Using an alarm as a reminder (kitchen timer or cell phone alarm) ?Using cell phone to store information (Notes, Calendar, Reminders) ?Calendar/White board placed in a prominent position ?Post-it notes ?  ?In order for memory aids to be useful, you need to have good habits.  It's no good remembering to make a note in your journal if you don't remember to look in it.  Try setting aside a certain time of day to look in journal. ?  ?4)  Improving mood and managing fatigue. ?There may be other factors that contribute to memory difficulties.  Factors, such as anxiety, depression and tiredness can affect memory. ?Regular gentle exercise can help improve your mood and give  you more energy. ?Simple relaxation techniques may help relieve symptoms of anxiety ?Try to get back to completing activities or hobbies you enjoyed doing in the past. ?Learn to pace yourself through activities to decrease fatigue. ?Find out about some local support groups where you can share experiences with others. ?Try and achieve 7-8 hours of sleep at night. ? ? ?
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
## 2022-01-31 ENCOUNTER — Encounter: Payer: Self-pay | Admitting: Family Medicine

## 2022-01-31 ENCOUNTER — Ambulatory Visit: Payer: Medicare Other | Admitting: Family Medicine

## 2022-01-31 VITALS — BP 156/78 | HR 75 | Ht 70.0 in | Wt 204.0 lb

## 2022-01-31 DIAGNOSIS — R413 Other amnesia: Secondary | ICD-10-CM | POA: Diagnosis not present

## 2022-01-31 DIAGNOSIS — Z9989 Dependence on other enabling machines and devices: Secondary | ICD-10-CM

## 2022-01-31 DIAGNOSIS — G4733 Obstructive sleep apnea (adult) (pediatric): Secondary | ICD-10-CM

## 2022-02-07 ENCOUNTER — Encounter: Payer: Medicare Other | Attending: Psychology | Admitting: Psychology

## 2022-02-07 DIAGNOSIS — R413 Other amnesia: Secondary | ICD-10-CM

## 2022-02-07 DIAGNOSIS — G4733 Obstructive sleep apnea (adult) (pediatric): Secondary | ICD-10-CM | POA: Insufficient documentation

## 2022-02-07 DIAGNOSIS — Z87820 Personal history of traumatic brain injury: Secondary | ICD-10-CM | POA: Diagnosis present

## 2022-02-11 ENCOUNTER — Ambulatory Visit
Admission: EM | Admit: 2022-02-11 | Discharge: 2022-02-11 | Disposition: A | Discharge status: Home / Self Care | Payer: Medicare Other | Attending: Internal Medicine | Admitting: Internal Medicine

## 2022-02-11 ENCOUNTER — Encounter: Payer: Self-pay | Admitting: Emergency Medicine

## 2022-02-11 DIAGNOSIS — S9002XA Contusion of left ankle, initial encounter: Secondary | ICD-10-CM

## 2022-02-11 NOTE — Discharge Instructions (Signed)
Please continue applying Neosporin to the site ?Then no signs of infection ?Gentle range of motion exercises ?Ibuprofen as needed for pain ?If you notice redness spreading away from the site to the lower leg or foot please return to urgent care to be reevaluated. ?

## 2022-02-11 NOTE — ED Provider Notes (Signed)
?[facility] ? ? ? ?CSN: [id number] ?Arrival date & time: 02/11/22  0808 ? ? ?  ? ?History   ?Chief Complaint ?No chief complaint on file. ? ? ?HPI ?Marvin Padilla is a 81 y.o. male comes to the urgent care with left ankle pain of 1 day duration.  He was doing some yard work yesterday when he hit medial malleolus of the left ankle against on plank of wood.  No significant redness.  No swelling.  Patient is able to bear weight.  No febrile episodes..  ? ?HPI ? ?Past Medical History:  ?Diagnosis Date  ? Generalized arthritis   ? High cholesterol   ? History of left bundle branch block (LBBB) 05/01/2015  ? Hypertension   ? Pacemaker   ? a. 09/14/16: CHB s/p PPM: Medtronic (serial number [id number]) pacemaker  ? TBI (traumatic brain injury) ([place]) 1964  ? unconscious for two weeks  ? ? ?Patient Active Problem List  ? Diagnosis Date Noted  ? OSA (obstructive sleep apnea) 12/01/2021  ? Closed fracture of midfacial bones ([place]) 10/09/2021  ? Closed fracture of orbit ([place]) 10/09/2021  ? Excessive daytime sleepiness 10/09/2021  ? Pacemaker ECG pattern 10/09/2021  ? Snoring 10/09/2021  ? Cardiomyopathy, unspecified type ([place]) 10/09/2021  ? 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 Memory loss 07/15/2021  ? Optic atrophy of left eye 01/16/2018  ? Pacemaker   ? Complete heart block ([place]) 09/14/2016  ? LBBB (left bundle branch block) 05/01/2015  ? Essential hypertension   ? Bradycardia 04/30/2015  ? High cholesterol   ? TBI (traumatic brain injury) ([place])   ? ? ?Past Surgical History:  ?Procedure Laterality Date  ? BACK SURGERY    ? EP IMPLANTABLE DEVICE N/A 09/14/2016  ? Procedure: Pacemaker Implant;  Surgeon: Evans Lance, MD;  Location: [place] [facility];  Service: Cardiovascular;  Laterality: N/A;  ? NOSE SURGERY    ? PACEMAKER IMPLANT  09/14/2016  ? ? ? ? ? ?Home Medications   ? ?Prior to Admission medications   ?Medication Sig Start Date End Date Taking? Authorizing Provider  ?ezetimibe (ZETIA) 10 MG tablet Take  10 mg by mouth daily.   Yes [provider]  ?meloxicam (MOBIC) 7.5 MG tablet Take 7.5 mg by mouth daily.   Yes [provider]  ?ACCU-CHEK GUIDE test strip  06/22/19   [provider]  ?amLODipine (NORVASC) 5 MG tablet Take 1 tablet by mouth daily. 01/19/21   [provider]  ?Blood Glucose Calibration (ACCU-CHEK GUIDE CONTROL) LIQD  06/22/19   [provider]  ?Clotrimazole (LOTRIMIN AF EX) Apply topically as needed.    [provider]  ?diphenhydramine-acetaminophen (TYLENOL PM) 25-500 MG TABS tablet Take 1 tablet by mouth at bedtime.    [provider]  ?docusate sodium (COLACE) 100 MG capsule Take 100 mg by mouth daily.    [provider]  ?finasteride (PROSCAR) 5 MG tablet Take 5 mg by mouth daily. 01/16/21   [provider]  ?glimepiride (AMARYL) 2 MG tablet Take 2 mg by mouth daily with breakfast.    [provider]  ?lisinopril-hydrochlorothiazide (PRINZIDE,ZESTORETIC) 20-25 MG tablet Take 1 tablet by mouth daily. 09/07/16   [provider]  ?metFORMIN (GLUCOPHAGE) 500 MG tablet Take 500 mg by mouth 2 (two) times daily with a meal.    [provider]  ?Multiple Vitamins-Minerals (MENS MULTIVITAMIN) TABS Take 1 tablet by mouth daily. 11/03/19   [provider]  ?simvastatin ([place])  40 MG tablet Take 40 mg by mouth daily. 04/02/15   [provider]  ?tamsulosin (FLOMAX) 0.4 MG CAPS capsule Take 0.4 mg by mouth daily. 01/16/21   [provider]  ?terbinafine (LAMISIL) 250 MG tablet Take 250 mg by mouth daily. 06/29/19   [provider]  ? ? ?Family History ?Family History  ?Problem Relation Age of Onset  ? Heart failure Mother   ? Heart failure Father   ? Lung cancer Maternal Grandfather   ? Cancer Sister   ? ? ?Social History ?Social History  ? ?Tobacco Use  ? Smoking status: Never  ? Smokeless tobacco: Never  ?Vaping Use  ? Vaping Use: Never used  ?Substance Use Topics  ?  Alcohol use: No  ? Drug use: No  ? ? ? ?Allergies   ?Poison ivy extract ? ? ?Review of Systems ?Review of Systems  ?Gastrointestinal: Negative.   ?Musculoskeletal:  Negative for arthralgias, gait problem and joint swelling.  ?Skin: Negative.  Negative for color change, pallor and wound.  ? ? ?Physical Exam ?Triage Vital Signs ?ED Triage Vitals  ?Enc Vitals Group  ?   BP 02/11/22 0818 101/64  ?   Pulse Rate 02/11/22 0818 82  ?   Resp 02/11/22 0818 18  ?   Temp 02/11/22 0818 (!) 97.5 ?F (36.4 ?C)  ?   Temp Source 02/11/22 0818 Oral  ?   SpO2 02/11/22 0818 98 %  ?   Weight --   ?   Height --   ?   Head Circumference --   ?   Peak Flow --   ?   Pain Score 02/11/22 0820 2  ?   Pain Loc --   ?   Pain Edu? --   ?   Excl. in [place]? --   ? ?No data found. ? ?Updated Vital Signs ?BP 101/64 (BP Location: Right Arm)   Pulse 82   Temp (!) 97.5 ?F (36.4 ?C) (Oral)   Resp 18   SpO2 98%  ? ?Visual Acuity ?Right Eye Distance:   ?Left Eye Distance:   ?Bilateral Distance:   ? ?Right Eye Near:   ?Left Eye Near:    ?Bilateral Near:    ? ?Physical Exam ?Vitals and nursing note reviewed.  ?Constitutional:   ?   General: He is not in acute distress. ?   Appearance: He is not ill-appearing.  ?Cardiovascular:  ?   Rate and Rhythm: Normal rate and regular rhythm.  ?Musculoskeletal:     ?   General: Normal range of motion.  ?   Comments: Full range of motion around the left ankle.  Tenderness on palpation over the medial malleolus of the left foot.  Mild bruising noted at the distal edge of the medial malleolus.  ?Neurological:  ?   Mental Status: He is alert.  ? ? ? ?UC Treatments / Results  ?Labs ?(all labs ordered are listed, but only abnormal results are displayed) ?Labs Reviewed - No data to display ? ?EKG ? ? ?Radiology ?No results found. ? ?Procedures ?Procedures (including critical care time) ? ?Medications Ordered in UC ?Medications - No data to display ? ?Initial Impression / Assessment and Plan / UC Course  ?I have reviewed the  triage vital signs and the nursing notes. ? ?Pertinent labs & imaging results that were available during my care of the patient were reviewed by me and considered in my medical decision making (see chart for details). ? ?  ? ?  1.  Contusion of the left ankle: ?No indication for x-ray ?No signs of infection noted ?Patient is encouraged to use Neosporin ?Ibuprofen or Tylenol as needed for pain. ?Infection precautions given ?Return precautions given. ?Final Clinical Impressions(s) / UC Diagnoses  ? ?Final diagnoses:  ?Contusion of left ankle, initial encounter  ? ? ? ?Discharge Instructions   ? ?  ?Please continue applying Neosporin to the site ?Then no signs of infection ?Gentle range of motion exercises ?Ibuprofen as needed for pain ?If you notice redness spreading away from the site to the lower leg or foot please return to urgent care to be reevaluated. ? ? ?ED Prescriptions   ?None ?  ? ?PDMP not reviewed this encounter. ?  ?Chase Picket, MD ?02/11/22 [phone] ? ?

## 2022-02-11 NOTE — ED Triage Notes (Signed)
Bumped left ankle yesterday while doing yard work, some redness to the site.  Patient has been using neosporin to area. ?

## 2022-03-01 ENCOUNTER — Encounter: Payer: Medicare Other | Attending: Psychology

## 2022-03-01 DIAGNOSIS — E7800 Pure hypercholesterolemia, unspecified: Secondary | ICD-10-CM | POA: Insufficient documentation

## 2022-03-01 DIAGNOSIS — R413 Other amnesia: Secondary | ICD-10-CM | POA: Diagnosis present

## 2022-03-01 DIAGNOSIS — I447 Left bundle-branch block, unspecified: Secondary | ICD-10-CM | POA: Insufficient documentation

## 2022-03-01 DIAGNOSIS — I679 Cerebrovascular disease, unspecified: Secondary | ICD-10-CM | POA: Insufficient documentation

## 2022-03-01 DIAGNOSIS — G9389 Other specified disorders of brain: Secondary | ICD-10-CM | POA: Insufficient documentation

## 2022-03-01 DIAGNOSIS — Z87820 Personal history of traumatic brain injury: Secondary | ICD-10-CM | POA: Diagnosis present

## 2022-03-01 DIAGNOSIS — I10 Essential (primary) hypertension: Secondary | ICD-10-CM | POA: Insufficient documentation

## 2022-03-01 DIAGNOSIS — Z818 Family history of other mental and behavioral disorders: Secondary | ICD-10-CM | POA: Insufficient documentation

## 2022-03-01 DIAGNOSIS — R450 Nervousness: Secondary | ICD-10-CM | POA: Insufficient documentation

## 2022-03-01 DIAGNOSIS — F419 Anxiety disorder, unspecified: Secondary | ICD-10-CM | POA: Diagnosis not present

## 2022-03-01 DIAGNOSIS — I6782 Cerebral ischemia: Secondary | ICD-10-CM | POA: Insufficient documentation

## 2022-03-01 DIAGNOSIS — G319 Degenerative disease of nervous system, unspecified: Secondary | ICD-10-CM | POA: Insufficient documentation

## 2022-03-01 DIAGNOSIS — Z950 Presence of cardiac pacemaker: Secondary | ICD-10-CM | POA: Insufficient documentation

## 2022-03-01 DIAGNOSIS — M1990 Unspecified osteoarthritis, unspecified site: Secondary | ICD-10-CM | POA: Diagnosis not present

## 2022-03-01 DIAGNOSIS — G4733 Obstructive sleep apnea (adult) (pediatric): Secondary | ICD-10-CM | POA: Diagnosis present

## 2022-03-01 DIAGNOSIS — Z8781 Personal history of (healed) traumatic fracture: Secondary | ICD-10-CM | POA: Insufficient documentation

## 2022-03-01 NOTE — Progress Notes (Unsigned)
Behavioral Observations The patient appeared well-groomed and appropriately dressed. His manners were polite and appropriate to the situation. His attitude toward testing was positive and he showed good effort.    Neuropsychology Note  NASRI BOAKYE completed 150 minutes of neuropsychological testing with technician, Marica Otter, BA, under the supervision of Arley Phenix, PsyD., Clinical Neuropsychologist. The patient did not appear overtly distressed by the testing session, per behavioral observation or via self-report to the technician. Rest breaks were offered.   Clinical Decision Making: In considering the patient's current level of functioning, level of presumed impairment, nature of symptoms, emotional and behavioral responses during clinical interview, level of literacy, and observed level of motivation/effort, a battery of tests was selected by Dr. Kieth Brightly during initial consultation on 02/07/2022. This was communicated to the technician. Communication between the neuropsychologist and technician was ongoing throughout the testing session and changes were made as deemed necessary based on patient performance on testing, technician observations and additional pertinent factors such as those listed above.  Tests Administered: Controlled Oral Word Association Test (COWAT; FAS & Animals)  Wechsler Adult Intelligence Scale, 4th Edition (WAIS-IV) Wechsler Memory Scale, 4th Edition (WMS-IV); Older Adult Battery    Results:  COWAT FAS Total = 19 Z = -1.90 Animals Total = 15 Z = -0.76      WAIS-IV  Composite Score Summary  Scale Sum of Scaled Scores Composite Score Percentile Rank 95% Conf. Interval Qualitative Description  Verbal Comprehension 33 VCI 105 63 99-110 Average  Perceptual Reasoning 25 PRI 90 25 84-97 Average  Working Memory 16 WMI 89 23 83-96 Low Average  Processing Speed 16 PSI 89 23 82-98 Low Average  Full Scale 90 FSIQ 93 32 89-97 Average  General  Ability 58 GAI 98 45 93-103 Average      Verbal Comprehension Subtests Summary  Subtest Raw Score Scaled Score Percentile Rank Reference Group Scaled Score SEM  Similarities 24 11 63 10 1.12  Vocabulary 40 11 63 11 0.73  Information 15 11 63 11 0.73  (Comprehension) 24 11 63 10 1.08       Perceptual Reasoning Subtests Summary  Subtest Raw Score Scaled Score Percentile Rank Reference Group Scaled Score SEM  Block Design 28 10 50 6 1.27  Matrix Reasoning 8 8 25 4  0.73  Visual Puzzles 7 7 16 5  0.99  (Picture Completion) 9 10 50 7 1.12       Working Raw Score Scaled Score Percentile Rank Reference Group Scaled Score SEM  Digit Span 18 7 16 5  0.79  Arithmetic 11 9 37 8 0.95       Processing Speed Subtests Summary  Subtest Raw Score Scaled Score Percentile Rank Reference Group Scaled Score SEM  Symbol Search 17 8 25 4  1.12  Coding 36 8 25 4  1.12         WMS-IV   Index Score Summary  Index Sum of Scaled Scores Index Score Percentile Rank 95% Confidence Interval Qualitative Descriptor  Auditory Memory (AMI) 34 91 27 85-98 Average  Visual Memory (VMI) 12 79 8 75-85 Borderline  Immediate Memory (IMI) 25 90 25 84-97 Average  Delayed Memory (DMI) 21 81 10 75-90 Low Average       Primary Subtest Scaled Score Summary  Subtest Domain Raw Score Scaled Score Percentile Rank  Logical Memory I AM 30 10 50  Logical Memory II AM 20 11 63  Verbal Paired Associates I AM 13 8 25   Verbal Paired  Associates II AM 2 5 5   Visual Reproduction I VM 21 7 16   Visual Reproduction II VM 4 5 5   Symbol Span VWM 13 9 37      Auditory Memory Process Score Summary  Process Score Raw Score Scaled Score Percentile Rank Cumulative Percentage (Base Rate)  LM II Recognition 20 - - >75%  VPA II Recognition 19 - - 3-9%        Visual Memory Process Score Summary  Process Score Raw Score Scaled Score Percentile Rank Cumulative Percentage (Base  Rate)  VR II Recognition 2 - - 17-25%       ABILITY-MEMORY ANALYSIS  Ability Score:  VCI: 105 Date of Testing:  WAIS-IV; WMS-IV 2022/03/01  Predicted Difference Method   Index Predicted WMS-IV Index Score Actual WMS-IV Index Score Difference Critical Value  Significant Difference Y/N Base Rate  Auditory Memory 103 91 12 9.39 Y 15-20%  Visual Memory 102 79 23 8.28 Y 5%  Immediate Memory 103 90 13 10.49 Y 15%  Delayed Memory 103 81 22 12.08 Y 5%  Statistical significance (critical value) at the .01 level.        Feedback to Patient: SYLVAIN HASTEN will return on 05/24/2022 for an interactive feedback session with Dr. 2022/03/03 at which time his test performances, clinical impressions and treatment recommendations will be reviewed in detail. The patient understands he can contact our office should he require our assistance before this time.  150 minutes spent face-to-face with patient administering standardized tests, 30 minutes spent scoring [name]). [CPT 05/26/2022, 96139]  Full report to follow.

## 2022-03-13 ENCOUNTER — Ambulatory Visit (INDEPENDENT_AMBULATORY_CARE_PROVIDER_SITE_OTHER): Payer: Medicare Other

## 2022-03-13 DIAGNOSIS — I442 Atrioventricular block, complete: Secondary | ICD-10-CM | POA: Diagnosis not present

## 2022-03-13 LAB — CUP PACEART REMOTE DEVICE CHECK
Battery Remaining Longevity: 48 mo
Battery Voltage: 2.98 V
Brady Statistic AP VP Percent: 16.21 %
Brady Statistic AP VS Percent: 0 %
Brady Statistic AS VP Percent: 83.73 %
Brady Statistic AS VS Percent: 0.06 %
Brady Statistic RA Percent Paced: 16.2 %
Brady Statistic RV Percent Paced: 99.88 %
Date Time Interrogation Session: 20230613093613
Implantable Lead Implant Date: 20171215
Implantable Lead Implant Date: 20171215
Implantable Lead Location: 753859
Implantable Lead Location: 753860
Implantable Lead Model: 5076
Implantable Lead Model: 5076
Implantable Pulse Generator Implant Date: 20171215
Lead Channel Impedance Value: 342 Ohm
Lead Channel Impedance Value: 380 Ohm
Lead Channel Impedance Value: 437 Ohm
Lead Channel Impedance Value: 437 Ohm
Lead Channel Pacing Threshold Amplitude: 0.625 V
Lead Channel Pacing Threshold Amplitude: 0.625 V
Lead Channel Pacing Threshold Pulse Width: 0.4 ms
Lead Channel Pacing Threshold Pulse Width: 0.4 ms
Lead Channel Sensing Intrinsic Amplitude: 2.25 mV
Lead Channel Sensing Intrinsic Amplitude: 2.25 mV
Lead Channel Sensing Intrinsic Amplitude: 7.875 mV
Lead Channel Sensing Intrinsic Amplitude: 7.875 mV
Lead Channel Setting Pacing Amplitude: 2 V
Lead Channel Setting Pacing Amplitude: 2.5 V
Lead Channel Setting Pacing Pulse Width: 0.4 ms
Lead Channel Setting Sensing Sensitivity: 2.8 mV

## 2022-03-27 ENCOUNTER — Encounter (HOSPITAL_BASED_OUTPATIENT_CLINIC_OR_DEPARTMENT_OTHER): Payer: Medicare Other | Admitting: Psychology

## 2022-03-27 DIAGNOSIS — I679 Cerebrovascular disease, unspecified: Secondary | ICD-10-CM

## 2022-03-27 DIAGNOSIS — Z87820 Personal history of traumatic brain injury: Secondary | ICD-10-CM

## 2022-03-27 DIAGNOSIS — R413 Other amnesia: Secondary | ICD-10-CM

## 2022-03-27 DIAGNOSIS — G4733 Obstructive sleep apnea (adult) (pediatric): Secondary | ICD-10-CM

## 2022-03-28 NOTE — Progress Notes (Signed)
Remote pacemaker transmission.   

## 2022-03-29 ENCOUNTER — Encounter: Payer: Self-pay | Admitting: Psychology
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
## 2022-04-09 ENCOUNTER — Encounter: Payer: Self-pay | Admitting: Family Medicine

## 2022-04-19 ENCOUNTER — Encounter: Payer: Self-pay | Admitting: Psychology

## 2022-04-19 ENCOUNTER — Encounter: Payer: Medicare Other | Attending: Psychology | Admitting: Psychology

## 2022-04-19 DIAGNOSIS — I679 Cerebrovascular disease, unspecified: Secondary | ICD-10-CM | POA: Insufficient documentation

## 2022-04-19 DIAGNOSIS — R413 Other amnesia: Secondary | ICD-10-CM | POA: Insufficient documentation

## 2022-04-19 DIAGNOSIS — Z87820 Personal history of traumatic brain injury: Secondary | ICD-10-CM | POA: Diagnosis not present

## 2022-04-19 DIAGNOSIS — G4733 Obstructive sleep apnea (adult) (pediatric): Secondary | ICD-10-CM | POA: Diagnosis not present

## 2022-04-19 NOTE — Progress Notes (Signed)
04/19/2022: 1 PM-2 PM  Today's visit was an in person visit that was conducted in outpatient clinic office.  Today I met with the patient and his wife and went over the results of the recent neuropsychological evaluation.  The patient is continuing to use his CPAP and I talked about ways to use it even more effectively and also using when he is taking naps during the day.  The patient reports that things have been stable since I saw him last.  We will have a follow-up appointment in approximately 9 months to determine if repeat testing is needed.  I have included a copy of the summary from the formal neuropsychological evaluation below for convenience and it can be found in its entirety in the patient's EMR dated 03/27/2022.    Impression/Diagnosis:                     The results of the current neuropsychological assessment are consistent with changes in expressive language functioning particularly with regard to lexical fluency and are consistent with subjective reports of changes in this domain.  The patient is showing clear signs of changes in auditory encoding but less so for visual encoding functions.  The patient is also showing indications of slowed information processing speed as well.  The patient appears to have adequately visual spatial components including visual analysis and organization and and his ability to identify visual anomalies within a visual gestalt.  The patient's visual spatial weaknesses were seen primarily related to visual reasoning and problem-solving and visual estimation rather than visual constructional type deficits.  The patient shows some consistent memory deficits that are also consistent with subjective reports.  The greatest deficit had to do with visual memory but there were identified difficulties with regard to auditory memory.  The patient also shows a significant loss of information over a period of delay and variability in his performance under cued recall formats  with no particular improvement for visual memory and some areas of auditory memory not improving under recognition/cued recall.  This pattern overall with regard to his memory functions would be most consistent with moderate difficulties in storage and organization of new learned information particularly for visual information and a loss of information over time with delayed memory deficits that are significant.  As far as diagnostic interpretations, the patient's subjective reports are quite consistent with obtained objective neuropsychological test data.  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  The patient is showing some indications of chronic small vessel ischemic disease as well but the degree of cerebral atrophy is within normal limits for age.  The pattern of strengths and weaknesses on cognitive test are not particularly consistent with those related to an Alzheimer's type condition and there is some indication of lateralization of memory functions and deficits.  The patient has recently been diagnosed with a significant sleep apnea that has been treated and has had cerebrovascular events.  I suspect that the primary culprit for his memory deficits are combination of ongoing involvement related to microvascular ischemic changes and chronic small vessel ischemic disease in conjunction with significant exacerbation related to obstructive sleep apnea.  He reports that he has had some improvements in his cognition with regular use of CPAP machine now.  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  He does continue to maintain adequate visual spatial functioning  but is describing more difficulties with geographic orientation etc.  While I do suggest that his current condition is a combination of previous TBI, cerebrovascular changes related to small vessel disease and significant deleterious impacts of his obstructive sleep apnea we cannot completely rule out an Alzheimer's type pattern.  While this would clearly be a late onset type pattern it cannot be completely ruled out.  As far as treatment recommendations, I do think that we will potentially benefit from repeat testing in approximately 9 months to be more definitive about diagnostic patterns although I do not think the most likely explanation is of a progressive cortical dementia such as Alzheimer's and is more combination of a number of factors primarily cerebrovascular changes, previous TBI and significant obstructive sleep apnea that is now just beginning to be effectively addressed after being expertly identified by his treating neurologist.  The patient is still been restricting his driving and was not to drive until his sleep patterns had improved with CPAP use over time.  This will continue to be looked at and addressed.  During the feedback session I well talk about repeat testing in approximately 9 months with the patient and go over in depth recommendations as far as recommendations to adjust to his memory weaknesses and expressive language changes.   Diagnosis:                                Memory loss   Small vessel disease, cerebrovascular   OSA (obstructive sleep apnea)   History of traumatic brain injury     _____________________ Ilean Skill, Psy.D. Clinical Neuropsychologist

## 2022-05-02 ENCOUNTER — Encounter: Payer: Self-pay | Admitting: Family Medicine

## 2022-05-03 ENCOUNTER — Ambulatory Visit: Payer: Medicare Other | Admitting: Psychology

## 2022-05-24 ENCOUNTER — Ambulatory Visit: Payer: Medicare Other | Admitting: Psychology

## 2022-06-12 ENCOUNTER — Ambulatory Visit (INDEPENDENT_AMBULATORY_CARE_PROVIDER_SITE_OTHER): Payer: Medicare Other

## 2022-06-12 DIAGNOSIS — I442 Atrioventricular block, complete: Secondary | ICD-10-CM

## 2022-06-15 LAB — CUP PACEART REMOTE DEVICE CHECK
Battery Remaining Longevity: 47 mo
Battery Voltage: 2.98 V
Brady Statistic AP VP Percent: 33.9 %
Brady Statistic AP VS Percent: 0 %
Brady Statistic AS VP Percent: 66.07 %
Brady Statistic AS VS Percent: 0.03 %
Brady Statistic RA Percent Paced: 33.85 %
Brady Statistic RV Percent Paced: 99.81 %
Date Time Interrogation Session: 20230912082341
Implantable Lead Implant Date: 20171215
Implantable Lead Implant Date: 20171215
Implantable Lead Location: 753859
Implantable Lead Location: 753860
Implantable Lead Model: 5076
Implantable Lead Model: 5076
Implantable Pulse Generator Implant Date: 20171215
Lead Channel Impedance Value: 342 Ohm
Lead Channel Impedance Value: 361 Ohm
Lead Channel Impedance Value: 418 Ohm
Lead Channel Impedance Value: 456 Ohm
Lead Channel Pacing Threshold Amplitude: 0.625 V
Lead Channel Pacing Threshold Amplitude: 0.625 V
Lead Channel Pacing Threshold Pulse Width: 0.4 ms
Lead Channel Pacing Threshold Pulse Width: 0.4 ms
Lead Channel Sensing Intrinsic Amplitude: 2.875 mV
Lead Channel Sensing Intrinsic Amplitude: 2.875 mV
Lead Channel Sensing Intrinsic Amplitude: 7 mV
Lead Channel Sensing Intrinsic Amplitude: 7 mV
Lead Channel Setting Pacing Amplitude: 2 V
Lead Channel Setting Pacing Amplitude: 2.5 V
Lead Channel Setting Pacing Pulse Width: 0.4 ms
Lead Channel Setting Sensing Sensitivity: 2.8 mV

## 2022-06-28 NOTE — Progress Notes (Signed)
Remote pacemaker transmission.   

## 2022-07-10 ENCOUNTER — Encounter: Payer: Self-pay | Admitting: Internal Medicine

## 2022-07-10 ENCOUNTER — Ambulatory Visit: Payer: Medicare Other | Attending: Internal Medicine | Admitting: Internal Medicine

## 2022-07-10 VITALS — BP 140/58 | HR 85 | Ht 70.0 in | Wt 215.0 lb

## 2022-07-10 DIAGNOSIS — I442 Atrioventricular block, complete: Secondary | ICD-10-CM

## 2022-07-10 DIAGNOSIS — I10 Essential (primary) hypertension: Secondary | ICD-10-CM

## 2022-07-10 DIAGNOSIS — Z950 Presence of cardiac pacemaker: Secondary | ICD-10-CM

## 2022-07-10 NOTE — Progress Notes (Signed)
HPI [name] returns today for followup. 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  No edema. No syncope. He denies medical or dietary non-compliance. He remains active working [employer]. He denies chest pain or sob. He walks 1.5 miles most days. Allergies  Allergen Reactions   Poison Ivy Extract      Current Outpatient Medications  Medication Sig Dispense Refill   ACCU-CHEK GUIDE test strip      amLODipine (NORVASC) 5 MG tablet Take 1 tablet by mouth daily.     Blood Glucose Calibration (ACCU-CHEK GUIDE CONTROL) LIQD      Clotrimazole (LOTRIMIN AF EX) Apply topically as needed.     diphenhydramine-acetaminophen (TYLENOL PM) 25-500 MG TABS tablet Take 1 tablet by mouth at bedtime.     docusate sodium (COLACE) 100 MG capsule Take 100 mg by mouth daily.     ezetimibe (ZETIA) 10 MG tablet Take 10 mg by mouth daily.     finasteride (PROSCAR) 5 MG tablet Take 5 mg by mouth daily.     glimepiride (AMARYL) 2 MG tablet Take 2 mg by mouth daily with breakfast.     lisinopril-hydrochlorothiazide (PRINZIDE,ZESTORETIC) 20-25 MG tablet Take 1 tablet by mouth daily.     meloxicam (MOBIC) 7.5 MG tablet Take 7.5 mg by mouth daily.     metFORMIN (GLUCOPHAGE) 500 MG tablet Take 500 mg by mouth 2 (two) times daily with a meal.     Multiple Vitamins-Minerals (MENS MULTIVITAMIN) TABS Take 1 tablet by mouth daily.     simvastatin (ZOCOR) 40 MG tablet Take 40 mg by mouth daily.  4   tamsulosin (FLOMAX) 0.4 MG CAPS capsule Take 0.4 mg by mouth daily.     terbinafine (LAMISIL) 250 MG tablet Take 250 mg by mouth daily.     triamcinolone cream (KENALOG) 0.1 % Apply topically.     No current facility-administered medications for this visit.     Past Medical History:  Diagnosis Date   Generalized arthritis    High cholesterol    History of left bundle branch block (LBBB) 05/01/2015   Hypertension    Pacemaker    a. 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   Past Surgical History:  Procedure Laterality Date   BACK SURGERY     EP IMPLANTABLE DEVICE N/A 09/14/2016   Procedure: Pacemaker Implant;  Surgeon: Evans Lance, MD;  Location: [facility];  Service: Cardiovascular;  Laterality: N/A;   NOSE SURGERY     PACEMAKER IMPLANT  09/14/2016     Family History  Problem Relation Age of Onset   Heart failure Mother    Heart failure Father    Lung cancer Maternal Grandfather    Cancer Sister      Social History   Socioeconomic History   Marital status: Married    Spouse name: Marvin Padilla   Number of children: 2   Years of education: Not on file   Highest education level: Associate degree: academic program  Occupational History   Not on file  Tobacco Use   Smoking status: Never   Smokeless tobacco: Never  Vaping Use   Vaping Use: Never used  Substance and Sexual Activity  Alcohol use: No   Drug use: No   Sexual activity: Not on file  Other Topics Concern   Not on file  Social History Narrative   Lives at home with wife and grandson   Right handed   Caffeine: 1/2 and 1/2 caffeine 2-3 cups in morning, maybe additional cup of regular in the afternoon   Social Determinants of Health   Financial Resource Strain: Not on file  Food Insecurity: Not on file  Transportation Needs: Not on file  Physical Activity: Not on file  Stress: Not on file  Social Connections: Not on file  Intimate Partner Violence: Not on file     BP (!) 140/58   Pulse 85   Ht 5\' 10"  (1.778 m)   Wt 215 lb (97.5 kg)   SpO2 96%   BMI 30.85 kg/m   Physical Exam:  Well appearing NAD HEENT: Unremarkable Neck:  No JVD, no thyromegally Lymphatics:  No adenopathy Back:  No CVA tenderness Lungs:  Clear with no wheezes HEART:  Regular rate rhythm,  no murmurs, no rubs, no clicks Abd:  soft, positive bowel sounds, no organomegally, no rebound, no guarding Ext:  2 plus pulses, no edema, no cyanosis, no clubbing Skin:  No rashes no nodules Neuro:  CN II through XII intact, motor grossly intact  EKG - nsr with ventricular pacing  DEVICE  Normal device function.  See PaceArt for details.   Assess/Plan:  1. CHB - his conduction remains absent. He is asymptomatic, s/p PPM  2. PPM - his medtronic device is working normally. He has about 4 years on the battery. 3. HTN - his bp is only minimally elevated. We will follow. 4. Dyslipidemia - he will continue his statin therapy.   Marvin Padilla Marvin Upham,MD

## 2022-07-10 NOTE — Patient Instructions (Signed)
Medication Instructions:  Your physician recommends that you continue on your current medications as directed. Please refer to the Current Medication list given to you today.  *If you need a refill on your cardiac medications before your next appointment, please call your pharmacy*  Lab Work: None ordered.  If you have labs (blood work) drawn today and your tests are completely normal, you will receive your results only by: [place] (if you have MyChart) OR A paper copy in the mail If you have any lab test that is abnormal or we need to change your treatment, we will call you to review the results.  Testing/Procedures: None ordered.  Follow-Up: At [facility], you and your health needs are our priority.  As part of our continuing mission to provide you with exceptional heart care, we have created designated Provider Care Teams.  These Care Teams include your primary Cardiologist (physician) and Advanced Practice Providers (APPs -  Physician Assistants and Nurse Practitioners) who all work together to provide you with the care you need, when you need it.  We recommend signing up for the patient portal called "MyChart".  Sign up information is provided on this After Visit Summary.  MyChart is used to connect with patients for Virtual Visits (Telemedicine).  Patients are able to view lab/test results, encounter notes, upcoming appointments, etc.  Non-urgent messages can be sent to your provider as well.   To learn more about what you can do with MyChart, go to [email or web address].    Your next appointment:   1 year(s)  The format for your next appointment:   In Person  Provider:   Cristopher Peru, MD{or one of the following Advanced Practice Providers on your designated Care Team:   Tommye Standard, [place] Legrand Como "Jonni Sanger" Chalmers Cater, [place]  Remote monitoring is used to monitor your Pacemaker from home. This monitoring reduces the number of office visits required to check your device to  one time per year. It allows [place] to keep an eye on the functioning of your device to ensure it is working properly. You are scheduled for a device check from home on 09/11/22. You may send your transmission at any time that day. If you have a wireless device, the transmission will be sent automatically. After your physician reviews your transmission, you will receive a postcard with your next transmission date.  Important Information About Sugar     ,
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
## 2022-08-09 ENCOUNTER — Ambulatory Visit: Payer: Medicare Other | Admitting: Family Medicine

## 2022-08-09 ENCOUNTER — Encounter: Payer: Self-pay | Admitting: Family Medicine

## 2022-08-09 VITALS — BP 148/83 | HR 61 | Ht 70.0 in | Wt 215.0 lb

## 2022-08-09 DIAGNOSIS — R413 Other amnesia: Secondary | ICD-10-CM | POA: Diagnosis not present

## 2022-08-09 DIAGNOSIS — G4733 Obstructive sleep apnea (adult) (pediatric): Secondary | ICD-10-CM | POA: Diagnosis not present

## 2022-08-09 NOTE — Patient Instructions (Signed)
Please continue using your CPAP regularly. While your insurance requires that you use CPAP at least 4 hours each night on 70% of the nights, I recommend, that you not skip any nights and use it throughout the night if you can. Getting used to CPAP and staying with the treatment long term does take time and patience and discipline. Untreated obstructive sleep apnea when it is moderate to severe can have an adverse impact on cardiovascular health and raise her risk for heart disease, arrhythmias, hypertension, congestive heart failure, stroke and diabetes. Untreated obstructive sleep apnea causes sleep disruption, nonrestorative sleep, and sleep deprivation. This can have an impact on your day to day functioning and cause daytime sleepiness and impairment of cognitive function, memory loss, mood disturbance, and problems focussing. Using CPAP regularly can improve these symptoms.   Follow up in 1 year  Management of Memory Problems   There are some general things you can do to help manage your memory problems.  Your memory may not in fact recover, but by using techniques and strategies you will be able to manage your memory difficulties better.   1)  Establish a routine. Try to establish and then stick to a regular routine.  By doing this, you will get used to what to expect and you will reduce the need to rely on your memory.  Also, try to do things at the same time of day, such as taking your medication or checking your calendar first thing in the morning. Think about think that you can do as a part of a regular routine and make a list.  Then enter them into a daily planner to remind you.  This will help you establish a routine.   2)  Organize your environment. Organize your environment so that it is uncluttered.  Decrease visual stimulation.  Place everyday items such as keys or cell phone in the same place every day (ie.  Basket next to front door) Use post it notes with a brief message to yourself  (ie. Turn off light, lock the door) Use labels to indicate where things go (ie. Which cupboards are for food, dishes, etc.) Keep a notepad and pen by the telephone to take messages   3)  Memory Aids A diary or journal/notebook/daily planner Making a list (shopping list, chore list, to do list that needs to be done) Using an alarm as a reminder (kitchen timer or cell phone alarm) Using cell phone to store information (Notes, Calendar, Reminders) Calendar/White board placed in a prominent position Post-it notes   In order for memory aids to be useful, you need to have good habits.  It's no good remembering to make a note in your journal if you don't remember to look in it.  Try setting aside a certain time of day to look in journal.   4)  Improving mood and managing fatigue. There may be other factors that contribute to memory difficulties.  Factors, such as anxiety, depression and tiredness can affect memory. Regular gentle exercise can help improve your mood and give you more energy. Exercise: there are short videos created by the [employer] on Health specially for older adults: https://bit.ly/2I30q97.  Mediterranean diet: which emphasizes fruits, vegetables, whole grains, legumes, fish, and other seafood; unsaturated fats such as olive oils; and low amounts of red meat, eggs, and sweets. A variation of this, called MIND [facility] Intervention for Neurodegenerative Delay) incorporates the DASH (Dietary Approaches to Stop Hypertension) diet, which has been shown to lower  high blood pressure, a risk factor for Alzheimer's disease. More information at: RepublicForum.gl.  Aerobic exercise that improve heart health is also good for the mind.  [employer] on Aging have short videos for exercises that you can do at home: GoldCloset.com.ee Simple relaxation techniques may help relieve symptoms of  anxiety Try to get back to completing activities or hobbies you enjoyed doing in the past. Learn to pace yourself through activities to decrease fatigue. Find out about some local support groups where you can share experiences with others. Try and achieve 7-8 hours of sleep at night.

## 2022-08-13 ENCOUNTER — Telehealth: Payer: Self-pay

## 2022-09-11 ENCOUNTER — Ambulatory Visit (INDEPENDENT_AMBULATORY_CARE_PROVIDER_SITE_OTHER): Payer: Medicare Other

## 2022-09-11 DIAGNOSIS — I442 Atrioventricular block, complete: Secondary | ICD-10-CM

## 2022-09-12 LAB — CUP PACEART REMOTE DEVICE CHECK
Battery Remaining Longevity: 44 mo
Battery Voltage: 2.97 V
Brady Statistic AP VP Percent: 47.23 %
Brady Statistic AP VS Percent: 0.02 %
Brady Statistic AS VP Percent: 52.7 %
Brady Statistic AS VS Percent: 0.05 %
Brady Statistic RA Percent Paced: 47.17 %
Brady Statistic RV Percent Paced: 99.78 %
Date Time Interrogation Session: 20231212203154
Implantable Lead Connection Status: 753985
Implantable Lead Connection Status: 753985
Implantable Lead Implant Date: 20171215
Implantable Lead Implant Date: 20171215
Implantable Lead Location: 753859
Implantable Lead Location: 753860
Implantable Lead Model: 5076
Implantable Lead Model: 5076
Implantable Pulse Generator Implant Date: 20171215
Lead Channel Impedance Value: 342 Ohm
Lead Channel Impedance Value: 361 Ohm
Lead Channel Impedance Value: 418 Ohm
Lead Channel Impedance Value: 437 Ohm
Lead Channel Pacing Threshold Amplitude: 0.625 V
Lead Channel Pacing Threshold Amplitude: 0.75 V
Lead Channel Pacing Threshold Pulse Width: 0.4 ms
Lead Channel Pacing Threshold Pulse Width: 0.4 ms
Lead Channel Sensing Intrinsic Amplitude: 11 mV
Lead Channel Sensing Intrinsic Amplitude: 11 mV
Lead Channel Sensing Intrinsic Amplitude: 2.375 mV
Lead Channel Sensing Intrinsic Amplitude: 2.375 mV
Lead Channel Setting Pacing Amplitude: 2 V
Lead Channel Setting Pacing Amplitude: 2.5 V
Lead Channel Setting Pacing Pulse Width: 0.4 ms
Lead Channel Setting Sensing Sensitivity: 2.8 mV
Zone Setting Status: 755011
Zone Setting Status: 755011

## 2022-10-10 NOTE — Progress Notes (Signed)
Remote pacemaker transmission.   

## 2022-12-11 ENCOUNTER — Ambulatory Visit (INDEPENDENT_AMBULATORY_CARE_PROVIDER_SITE_OTHER): Payer: Medicare Other

## 2022-12-11 DIAGNOSIS — I442 Atrioventricular block, complete: Secondary | ICD-10-CM | POA: Diagnosis not present

## 2022-12-13 LAB — CUP PACEART REMOTE DEVICE CHECK
Battery Remaining Longevity: 39 mo
Battery Voltage: 2.97 V
Brady Statistic AP VP Percent: 39.75 %
Brady Statistic AP VS Percent: 0.03 %
Brady Statistic AS VP Percent: 60.14 %
Brady Statistic AS VS Percent: 0.08 %
Brady Statistic RA Percent Paced: 39.75 %
Brady Statistic RV Percent Paced: 99.8 %
Date Time Interrogation Session: 20240312083459
Implantable Lead Connection Status: 753985
Implantable Lead Connection Status: 753985
Implantable Lead Implant Date: 20171215
Implantable Lead Implant Date: 20171215
Implantable Lead Location: 753859
Implantable Lead Location: 753860
Implantable Lead Model: 5076
Implantable Lead Model: 5076
Implantable Pulse Generator Implant Date: 20171215
Lead Channel Impedance Value: 361 Ohm
Lead Channel Impedance Value: 380 Ohm
Lead Channel Impedance Value: 418 Ohm
Lead Channel Impedance Value: 456 Ohm
Lead Channel Pacing Threshold Amplitude: 0.625 V
Lead Channel Pacing Threshold Amplitude: 0.75 V
Lead Channel Pacing Threshold Pulse Width: 0.4 ms
Lead Channel Pacing Threshold Pulse Width: 0.4 ms
Lead Channel Sensing Intrinsic Amplitude: 2.75 mV
Lead Channel Sensing Intrinsic Amplitude: 2.75 mV
Lead Channel Sensing Intrinsic Amplitude: 7.75 mV
Lead Channel Sensing Intrinsic Amplitude: 7.75 mV
Lead Channel Setting Pacing Amplitude: 2 V
Lead Channel Setting Pacing Amplitude: 2.5 V
Lead Channel Setting Pacing Pulse Width: 0.4 ms
Lead Channel Setting Sensing Sensitivity: 2.8 mV
Zone Setting Status: 755011
Zone Setting Status: 755011

## 2023-01-09 ENCOUNTER — Encounter: Payer: Medicare Other | Attending: Psychology | Admitting: Psychology

## 2023-01-09 ENCOUNTER — Encounter: Payer: Self-pay | Admitting: Psychology

## 2023-01-09 DIAGNOSIS — I679 Cerebrovascular disease, unspecified: Secondary | ICD-10-CM | POA: Diagnosis present

## 2023-01-09 DIAGNOSIS — R413 Other amnesia: Secondary | ICD-10-CM | POA: Diagnosis not present

## 2023-01-09 DIAGNOSIS — G4733 Obstructive sleep apnea (adult) (pediatric): Secondary | ICD-10-CM | POA: Insufficient documentation

## 2023-01-09 DIAGNOSIS — Z87820 Personal history of traumatic brain injury: Secondary | ICD-10-CM | POA: Insufficient documentation
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
## 2023-01-21 NOTE — Progress Notes (Signed)
Remote pacemaker transmission.   

## 2023-02-15 ENCOUNTER — Ambulatory Visit: Payer: Medicare Other | Admitting: Podiatry

## 2023-02-15 DIAGNOSIS — L600 Ingrowing nail: Secondary | ICD-10-CM

## 2023-02-15 DIAGNOSIS — B351 Tinea unguium: Secondary | ICD-10-CM | POA: Diagnosis not present

## 2023-02-15 NOTE — Patient Instructions (Signed)

## 2023-02-15 NOTE — Progress Notes (Signed)
Subjective:   Patient ID: Marvin Padilla, male   DOB: 82 y.o.   MRN: [id number]   HPI Patient presents with severely thickened deformed big toenail left painful when pressed that has given him trouble for several years and has fallen off a number of times and is becoming increasingly a problem.  Patient does not smoke likes to be active   Review of Systems  All other systems reviewed and are negative.       Objective:  Physical Exam Vitals and nursing note reviewed.  Constitutional:      Appearance: He is well-developed.  Pulmonary:     Effort: Pulmonary effort is normal.  Musculoskeletal:        General: Normal range of motion.  Skin:    General: Skin is warm.  Neurological:     Mental Status: He is alert.     Neurovascular status intact muscle strength was found to be adequate range of motion adequate with patient found to have a thickened deformed partially detached left hallux nail bed that is not adhered to the underlying structure.  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  Patient wants surgery and I explained the procedure and risk and he signed consent form and patient willing to accept risk wants this to be fixed.  At this point I infiltrated the left big toe 60 mg like Marcaine mixture sterile prep done and using sterile instrumentation remove the left hallux nail exposed matrix and applied phenol 5 applications 30 seconds followed by alcohol lavage sterile dressing gave instructions on soaks wear dressing 24 hours take it off earlier if throbbing were to occur

## 2023-02-19 ENCOUNTER — Encounter: Payer: Medicare Other | Attending: Psychology

## 2023-02-19 DIAGNOSIS — G4733 Obstructive sleep apnea (adult) (pediatric): Secondary | ICD-10-CM | POA: Insufficient documentation

## 2023-02-19 DIAGNOSIS — Z87820 Personal history of traumatic brain injury: Secondary | ICD-10-CM | POA: Insufficient documentation

## 2023-02-19 DIAGNOSIS — R413 Other amnesia: Secondary | ICD-10-CM | POA: Diagnosis not present

## 2023-02-19 DIAGNOSIS — I679 Cerebrovascular disease, unspecified: Secondary | ICD-10-CM | POA: Insufficient documentation

## 2023-02-26 NOTE — Progress Notes (Signed)
Behavioral Observations: The patient appeared well-groomed and appropriately dressed. His manners were polite and appropriate to the situation. The patient's attitude towards testing was positive and his effort was good.   Neuropsychology Note  [name] completed 105 minutes of neuropsychological testing with technician, Staci Acosta, BA, under the supervision of Arley Phenix, PsyD., Clinical Neuropsychologist. The patient did not appear overtly distressed by the testing session, per behavioral observation or via self-report to the technician. Rest breaks were offered.   Clinical Decision Making: In considering the patient's current level of functioning, level of presumed impairment, nature of symptoms, emotional and behavioral responses during clinical interview, level of literacy, and observed level of motivation/effort, a battery of tests was selected by Dr. Kieth Brightly during initial consultation on 01/09/2023. This was communicated to the technician. Communication between the neuropsychologist and technician was ongoing throughout the testing session and changes were made as deemed necessary based on patient performance on testing, technician observations and additional pertinent factors such as those listed above.  Tests Administered: Controlled Oral Word Association Test (COWAT; FAS & Animals)  Wechsler Adult Intelligence Scale, 4th Edition (WAIS-IV) Wechsler Memory Scale, 4th Edition (WMS-IV); Older Adult Battery   Results:  COWAT:  FAS total= 21 Z= -0.68 Animals total= 12 Z= -0.59  WAIS-IV: Composite Score Summary  Scale Sum of Scaled Scores Composite Score Percentile Rank 95% Conf. Interval Qualitative Description  Verbal Comprehension 33 VCI 105 63 99-110 Average  Perceptual Reasoning 31 PRI 102 55 96-108 Average  Working Memory 19 WMI 97 42 90-104 Average  Processing Speed 16 PSI 89 23 82-98 Low Average  Full Scale 99 FSIQ 99 47 95-103 Average  General  Ability 64 GAI 103 58 98-108 Average   Verbal Comprehension Subtests Summary  Subtest Raw Score Scaled Score Percentile Rank Reference Group Scaled Score SEM  Similarities 24 11 63 10 0.90  Vocabulary 37 11 63 10 0.73  Information 14 11 63 11 0.73  The scaled scores in the Reference Group Scaled Score column are based on the performance of examinees aged 20:0-34:11 (i.e., the reference group). See Chapter 6 of the WAIS-IV Technical and Interpretive Manual for more information.  Perceptual Reasoning Subtests Summary  Subtest Raw Score Scaled Score Percentile Rank Reference Group Scaled Score SEM  Block Design 32 12 75 7 1.34  Matrix Reasoning 8 9 37 4 1.12  Visual Puzzles 9 10 50 6 1.27   Working Librarian, academic Raw Score Scaled Score Percentile Rank Reference Group Scaled Score SEM  Digit Span 21 9 37 6 0.85  Arithmetic 12 10 50 9 0.99   Processing Speed Subtests Summary  Subtest Raw Score Scaled Score Percentile Rank Reference Group Scaled Score SEM  Symbol Search 10 6 9 2  1.12  Coding 39 10 50 4 1.12   WMS-IV:   Index Score Summary  Index Sum of Scaled Scores Index Score Percentile Rank 95% Confidence Interval Qualitative Descriptor  Auditory Memory (AMI) 39 98 45 92-104 Average  Visual Memory (VMI) 17 92 30 87-97 Average  Immediate Memory (IMI) 27 93 32 87-100 Average  Delayed Memory (DMI) 29 98 45 91-106 Average   Primary Subtest Scaled Score Summary  Subtest Domain Raw Score Scaled Score Percentile Rank  Logical Memory I AM 28 10 50  Logical Memory II AM 17 11 63  Verbal Paired Associates I AM 13 8 25   Verbal Paired Associates II AM 5 10 50  Visual Reproduction I VM 22 9 37  Visual Reproduction II VM 9 8 25   Symbol Span VWM 4 4 2     Auditory Memory Process Score Summary  Process Score Raw Score Scaled Score Percentile Rank Cumulative Percentage (Base Rate)  LM II Recognition 17 - - 26-50%  VPA II Recognition 25 - - 26-50%   Visual Memory  Process Score Summary  Process Score Raw Score Scaled Score Percentile Rank Cumulative Percentage (Base Rate)  VR II Recognition 1 - - 3-9%   ABILITY-MEMORY ANALYSIS  Ability Score:  VCI: 105 Date of Testing:  WAIS-IV; WMS-IV 2023/02/19  Predicted Difference Method   Index Predicted WMS-IV Index Score Actual WMS-IV Index Score Difference Critical Value  Significant Difference Y/N Base Rate  Auditory Memory 103 98 5 9.14 N   Visual Memory 102 92 10 7.30 Y 25%  Immediate Memory 103 93 10 10.12 N   Delayed Memory 103 98 5 10.62 N   Statistical significance (critical value) at the .01 level.   Feedback to Patient: Marvin Padilla will return on 11/25/2023 for an interactive feedback session with Dr. Kieth Brightly at which time his test performances, clinical impressions and treatment recommendations will be reviewed in detail. The patient understands he can contact our office should he require our assistance before this time.  105 minutes spent face-to-face with patient administering standardized tests, 30 minutes spent scoring Radiographer, therapeutic). [CPT [id number], [id number]]  Full report to follow.

## 2023-03-12 ENCOUNTER — Ambulatory Visit (INDEPENDENT_AMBULATORY_CARE_PROVIDER_SITE_OTHER): Payer: Medicare Other

## 2023-03-12 DIAGNOSIS — I442 Atrioventricular block, complete: Secondary | ICD-10-CM

## 2023-03-15 LAB — CUP PACEART REMOTE DEVICE CHECK
Battery Remaining Longevity: 36 mo
Battery Voltage: 2.96 V
Brady Statistic AP VP Percent: 46.14 %
Brady Statistic AP VS Percent: 0.04 %
Brady Statistic AS VP Percent: 53.76 %
Brady Statistic AS VS Percent: 0.07 %
Brady Statistic RA Percent Paced: 46.07 %
Brady Statistic RV Percent Paced: 99.72 %
Date Time Interrogation Session: 20240613150440
Implantable Lead Connection Status: 753985
Implantable Lead Connection Status: 753985
Implantable Lead Implant Date: 20171215
Implantable Lead Implant Date: 20171215
Implantable Lead Location: 753859
Implantable Lead Location: 753860
Implantable Lead Model: 5076
Implantable Lead Model: 5076
Implantable Pulse Generator Implant Date: 20171215
Lead Channel Impedance Value: 342 Ohm
Lead Channel Impedance Value: 380 Ohm
Lead Channel Impedance Value: 418 Ohm
Lead Channel Impedance Value: 437 Ohm
Lead Channel Pacing Threshold Amplitude: 0.625 V
Lead Channel Pacing Threshold Amplitude: 0.75 V
Lead Channel Pacing Threshold Pulse Width: 0.4 ms
Lead Channel Pacing Threshold Pulse Width: 0.4 ms
Lead Channel Sensing Intrinsic Amplitude: 2 mV
Lead Channel Sensing Intrinsic Amplitude: 2 mV
Lead Channel Sensing Intrinsic Amplitude: 7.75 mV
Lead Channel Sensing Intrinsic Amplitude: 7.75 mV
Lead Channel Setting Pacing Amplitude: 2 V
Lead Channel Setting Pacing Amplitude: 2.5 V
Lead Channel Setting Pacing Pulse Width: 0.4 ms
Lead Channel Setting Sensing Sensitivity: 2.8 mV
Zone Setting Status: 755011
Zone Setting Status: 755011

## 2023-04-09 NOTE — Progress Notes (Signed)
Remote pacemaker transmission.   

## 2023-06-11 ENCOUNTER — Ambulatory Visit (INDEPENDENT_AMBULATORY_CARE_PROVIDER_SITE_OTHER): Payer: Medicare Other

## 2023-06-11 DIAGNOSIS — I442 Atrioventricular block, complete: Secondary | ICD-10-CM

## 2023-06-12 LAB — CUP PACEART REMOTE DEVICE CHECK
Battery Remaining Longevity: 35 mo
Battery Voltage: 2.95 V
Brady Statistic AP VP Percent: 54.55 %
Brady Statistic AP VS Percent: 0.05 %
Brady Statistic AS VP Percent: 45.31 %
Brady Statistic AS VS Percent: 0.09 %
Brady Statistic RA Percent Paced: 53.42 %
Brady Statistic RV Percent Paced: 98.34 %
Date Time Interrogation Session: 20240910100135
Implantable Lead Connection Status: 753985
Implantable Lead Connection Status: 753985
Implantable Lead Implant Date: 20171215
Implantable Lead Implant Date: 20171215
Implantable Lead Location: 753859
Implantable Lead Location: 753860
Implantable Lead Model: 5076
Implantable Lead Model: 5076
Implantable Pulse Generator Implant Date: 20171215
Lead Channel Impedance Value: 323 Ohm
Lead Channel Impedance Value: 361 Ohm
Lead Channel Impedance Value: 418 Ohm
Lead Channel Impedance Value: 418 Ohm
Lead Channel Pacing Threshold Amplitude: 0.625 V
Lead Channel Pacing Threshold Amplitude: 0.875 V
Lead Channel Pacing Threshold Pulse Width: 0.4 ms
Lead Channel Pacing Threshold Pulse Width: 0.4 ms
Lead Channel Sensing Intrinsic Amplitude: 10 mV
Lead Channel Sensing Intrinsic Amplitude: 10 mV
Lead Channel Sensing Intrinsic Amplitude: 2 mV
Lead Channel Sensing Intrinsic Amplitude: 2 mV
Lead Channel Setting Pacing Amplitude: 2 V
Lead Channel Setting Pacing Amplitude: 2.5 V
Lead Channel Setting Pacing Pulse Width: 0.4 ms
Lead Channel Setting Sensing Sensitivity: 2.8 mV
Zone Setting Status: 755011
Zone Setting Status: 755011

## 2023-06-28 NOTE — Progress Notes (Signed)
Remote pacemaker transmission.   

## 2023-08-15 ENCOUNTER — Ambulatory Visit: Payer: Medicare Other | Admitting: Family Medicine

## 2023-08-19 ENCOUNTER — Encounter: Payer: Self-pay | Admitting: Podiatry

## 2023-08-19 ENCOUNTER — Ambulatory Visit: Payer: Medicare Other | Admitting: Podiatry

## 2023-08-19 DIAGNOSIS — M7752 Other enthesopathy of left foot: Secondary | ICD-10-CM

## 2023-08-19 DIAGNOSIS — M2022 Hallux rigidus, left foot: Secondary | ICD-10-CM | POA: Diagnosis not present

## 2023-08-19 MED ORDER — TRIAMCINOLONE ACETONIDE 10 MG/ML IJ SUSP
10.0000 mg | Freq: Once | INTRAMUSCULAR | Status: AC
  Administered 2023-08-19: 10 mg via INTRA_ARTICULAR

## 2023-08-19 NOTE — Progress Notes (Signed)
Subjective:   Patient ID: Marvin Padilla, male   DOB: 82 y.o.   MRN: [id number]   HPI Patient states he has had some redness in his big toe left foot and states that he is a diabetic and its come on and off a couple times over the last few months   ROS      Objective:  Physical Exam  Neurovascular status intact muscle strength found to be adequate range of motion adequate with patient noted to have inflammation of the inner phalangeal joint left big toe with nail that have been removed that healed very well with no indications of pathology.  Also noted to have secondary condition of significant range of motion loss with moderate discomfort of the first MPJ left     Assessment:  Hallux limitus rigidus deformity left along with inflammatory interphalangeal joint pain and capsulitis left which may be systemic or local with blood work he had done not indicating systemic cause as far as gout goes     Plan:  H&P reviewed condition at this point I am going to try to reduce the inflammatory complex inner phalangeal joint 3 mg dexamethasone Kenalog 5 mg Xylocaine with sterile dressing.  I then discussed the MPJ and eventually that may require surgery and this also might be causing the problem here as he has lost quite a bit of motion at that joint.  Reviewed all questions with

## 2023-08-27 ENCOUNTER — Encounter: Payer: Self-pay | Admitting: Internal Medicine

## 2023-08-27 ENCOUNTER — Ambulatory Visit: Payer: Medicare Other | Attending: Internal Medicine | Admitting: Internal Medicine

## 2023-08-27 VITALS — BP 162/80 | HR 60 | Ht 70.0 in | Wt 211.2 lb

## 2023-08-27 DIAGNOSIS — I442 Atrioventricular block, complete: Secondary | ICD-10-CM | POA: Diagnosis not present

## 2023-08-27 DIAGNOSIS — R0609 Other forms of dyspnea: Secondary | ICD-10-CM

## 2023-08-27 LAB — CUP PACEART INCLINIC DEVICE CHECK
Battery Remaining Longevity: 34 mo
Battery Voltage: 2.95 V
Brady Statistic AP VP Percent: 46.98 %
Brady Statistic AP VS Percent: 0.04 %
Brady Statistic AS VP Percent: 52.89 %
Brady Statistic AS VS Percent: 0.09 %
Brady Statistic RA Percent Paced: 46.44 %
Brady Statistic RV Percent Paced: 98.98 %
Date Time Interrogation Session: 20241126162955
Implantable Lead Connection Status: 753985
Implantable Lead Connection Status: 753985
Implantable Lead Implant Date: 20171215
Implantable Lead Implant Date: 20171215
Implantable Lead Location: 753859
Implantable Lead Location: 753860
Implantable Lead Model: 5076
Implantable Lead Model: 5076
Implantable Pulse Generator Implant Date: 20171215
Lead Channel Impedance Value: 361 Ohm
Lead Channel Impedance Value: 399 Ohm
Lead Channel Impedance Value: 437 Ohm
Lead Channel Impedance Value: 456 Ohm
Lead Channel Pacing Threshold Amplitude: 0.625 V
Lead Channel Pacing Threshold Amplitude: 0.75 V
Lead Channel Pacing Threshold Pulse Width: 0.4 ms
Lead Channel Pacing Threshold Pulse Width: 0.4 ms
Lead Channel Sensing Intrinsic Amplitude: 2.125 mV
Lead Channel Sensing Intrinsic Amplitude: 2.25 mV
Lead Channel Sensing Intrinsic Amplitude: 8 mV
Lead Channel Sensing Intrinsic Amplitude: 8 mV
Lead Channel Setting Pacing Amplitude: 2 V
Lead Channel Setting Pacing Amplitude: 2.5 V
Lead Channel Setting Pacing Pulse Width: 0.4 ms
Lead Channel Setting Sensing Sensitivity: 2.8 mV
Zone Setting Status: 755011
Zone Setting Status: 755011

## 2023-08-27 NOTE — Patient Instructions (Addendum)
Medication Instructions:  Your physician recommends that you continue on your current medications as directed. Please refer to the Current Medication list given to you today.  *If you need a refill on your cardiac medications before your next appointment, please call your pharmacy*  Lab Work: None ordered.  If you have labs (blood work) drawn today and your tests are completely normal, you will receive your results only by: MyChart Message (if you have MyChart) OR A paper copy in the mail If you have any lab test that is abnormal or we need to change your treatment, we will call you to review the results.  Testing/Procedures: Echocardiogram   Follow-Up: At [facility], you and your health needs are our priority.  As part of our continuing mission to provide you with exceptional heart care, we have created designated Provider Care Teams.  These Care Teams include your primary Cardiologist (physician) and Advanced Practice Providers (APPs -  Physician Assistants and Nurse Practitioners) who all work together to provide you with the care you need, when you need it.  Your next appointment:   To be determined after testing.  The format for your next appointment:   In Person  Provider:   Lewayne Bunting, MD{or one of the following Advanced Practice Providers on your designated Care Team:   Francis Dowse, [place] Casimiro Needle "Mardelle Matte" [place], [place] Earnest Rosier, NP  Remote monitoring is used to monitor your Pacemaker/ ICD from home. This monitoring reduces the number of office visits required to check your device to one time per year. It allows [place] to keep an eye on the functioning of your device to ensure it is working properly.   Important Information About Sugar
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
## 2023-09-02 ENCOUNTER — Encounter: Payer: Self-pay | Admitting: Family Medicine

## 2023-09-02 ENCOUNTER — Ambulatory Visit: Payer: Medicare Other | Admitting: Family Medicine

## 2023-09-02 VITALS — BP 162/85 | HR 68 | Ht 70.0 in | Wt 211.5 lb

## 2023-09-02 DIAGNOSIS — G4733 Obstructive sleep apnea (adult) (pediatric): Secondary | ICD-10-CM
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
## 2023-09-02 NOTE — Patient Instructions (Addendum)
Please continue using your CPAP regularly. While your insurance requires that you use CPAP at least 4 hours each night on 70% of the nights, I recommend, that you not skip any nights and use it throughout the night if you can. Getting used to CPAP and staying with the treatment long term does take time and patience and discipline. Untreated obstructive sleep apnea when it is moderate to severe can have an adverse impact on cardiovascular health and raise her risk for heart disease, arrhythmias, hypertension, congestive heart failure, stroke and diabetes. Untreated obstructive sleep apnea causes sleep disruption, nonrestorative sleep, and sleep deprivation. This can have an impact on your day to day functioning and cause daytime sleepiness and impairment of cognitive function, memory loss, mood disturbance, and problems focussing. Using CPAP regularly can improve these symptoms.  We will update supply orders, today.   Follow up in 6 months for review of CPAP use. We can review Dr Marvetta Gibbons results as well.    Management of Memory Problems   There are some general things you can do to help manage your memory problems.  Your memory may not in fact recover, but by using techniques and strategies you will be able to manage your memory difficulties better.   1)  Establish a routine. Try to establish and then stick to a regular routine.  By doing this, you will get used to what to expect and you will reduce the need to rely on your memory.  Also, try to do things at the same time of day, such as taking your medication or checking your calendar first thing in the morning. Think about think that you can do as a part of a regular routine and make a list.  Then enter them into a daily planner to remind you.  This will help you establish a routine.   2)  Organize your environment. Organize your environment so that it is uncluttered.  Decrease visual stimulation.  Place everyday items such as keys or cell  phone in the same place every day (ie.  Basket next to front door) Use post it notes with a brief message to yourself (ie. Turn off light, lock the door) Use labels to indicate where things go (ie. Which cupboards are for food, dishes, etc.) Keep a notepad and pen by the telephone to take messages   3)  Memory Aids A diary or journal/notebook/daily planner Making a list (shopping list, chore list, to do list that needs to be done) Using an alarm as a reminder (kitchen timer or cell phone alarm) Using cell phone to store information (Notes, Calendar, Reminders) Calendar/White board placed in a prominent position Post-it notes   In order for memory aids to be useful, you need to have good habits.  It's no good remembering to make a note in your journal if you don't remember to look in it.  Try setting aside a certain time of day to look in journal.   4)  Improving mood and managing fatigue. There may be other factors that contribute to memory difficulties.  Factors, such as anxiety, depression and tiredness can affect memory. Regular gentle exercise can help improve your mood and give you more energy. Exercise: there are short videos created by the [employer] on Health specially for older adults: https://bit.ly/2I30q97.  Mediterranean diet: which emphasizes fruits, vegetables, whole grains, legumes, fish, and other seafood; unsaturated fats such as olive oils; and low amounts of red meat, eggs, and sweets. A variation of this,  called MIND [facility] Intervention for Neurodegenerative Delay) incorporates the DASH (Dietary Approaches to Stop Hypertension) diet, which has been shown to lower high blood pressure, a risk factor for Alzheimer's disease. More information at: ExitMarketing.de.  Aerobic exercise that improve heart health is also good for the mind.  [employer] on Aging have short videos for  exercises that you can do at home: BlindWorkshop.com.pt Simple relaxation techniques may help relieve symptoms of anxiety Try to get back to completing activities or hobbies you enjoyed doing in the past. Learn to pace yourself through activities to decrease fatigue. Find out about some local support groups where you can share experiences with others. Try and achieve 7-8 hours of sleep at night.   Tasks to improve attention/working memory 1. Good sleep hygiene (7-8 hrs of sleep) 2. Learning a new skill (Painting, Carpentry, Pottery, new language, Knitting). 3.Cognitive exercises (keep a daily journal, Puzzles) 4. Physical exercise and training  (30 min/day X 4 days week) 5. Being on Antidepressant if needed 6.Yoga, Meditation, Tai Chi 7. Decrease alcohol intake 8.Have a clear schedule and structure in daily routine   MIND Diet: The Mediterranean-DASH Diet Intervention for Neurodegenerative Delay, or MIND diet, targets the health of the aging brain. Research participants with the highest MIND diet scores had a significantly slower rate of cognitive decline compared with those with the lowest scores. The effects of the MIND diet on cognition showed greater effects than either the Mediterranean or the DASH diet alone.   The healthy items the MIND diet guidelines suggest include:   3+ servings a day of whole grains 1+ servings a day of vegetables (other than green leafy) 6+ servings a week of green leafy vegetables 5+ servings a week of nuts 4+ meals a week of beans 2+ servings a week of berries 2+ meals a week of poultry 1+ meals a week of fish Mainly olive oil if added fat is used   The unhealthy items, which are higher in saturated and trans fat, include: Less than 5 servings a week of pastries and sweets Less than 4 servings a week of red meat (including beef, pork, lamb, and products made from these meats) Less than one serving a week of cheese and fried foods Less than 1  tablespoon a day of butter/stick margarine

## 2023-09-03 ENCOUNTER — Telehealth: Payer: Self-pay

## 2023-09-03 NOTE — Telephone Encounter (Signed)
LVM for wife or pt to call back to discuss if we need to send all info to new DME company since he is switching from [employer].

## 2023-09-03 NOTE — Telephone Encounter (Signed)
Wife called and stated that they had gotten a letter from [facility] that they will be taking over pt's CPAP Supplies. Wife stated that they haven't activated pt's account yet, and that she was going to do that today and send [place] account information as well as company fax number.

## 2023-09-05 NOTE — Telephone Encounter (Signed)
I called patient wife Jasmine December she has not be able to get in contact with [facility] yet, they are currently at the eye doctor. Jasmine December said she will call again once she gets home and let us know via my chart.

## 2023-09-10 ENCOUNTER — Ambulatory Visit (INDEPENDENT_AMBULATORY_CARE_PROVIDER_SITE_OTHER): Payer: Medicare Other

## 2023-09-10 DIAGNOSIS — I442 Atrioventricular block, complete: Secondary | ICD-10-CM | POA: Diagnosis not present

## 2023-09-11 LAB — CUP PACEART REMOTE DEVICE CHECK
Battery Remaining Longevity: 35 mo
Battery Voltage: 2.95 V
Brady Statistic AP VP Percent: 23.77 %
Brady Statistic AP VS Percent: 58.56 %
Brady Statistic AS VP Percent: 0.01 %
Brady Statistic AS VS Percent: 17.65 %
Brady Statistic RA Percent Paced: 81.31 %
Brady Statistic RV Percent Paced: 23.24 %
Date Time Interrogation Session: 20241210093508
Implantable Lead Connection Status: 753985
Implantable Lead Connection Status: 753985
Implantable Lead Implant Date: 20171215
Implantable Lead Implant Date: 20171215
Implantable Lead Location: 753859
Implantable Lead Location: 753860
Implantable Lead Model: 5076
Implantable Lead Model: 5076
Implantable Pulse Generator Implant Date: 20171215
Lead Channel Impedance Value: 342 Ohm
Lead Channel Impedance Value: 380 Ohm
Lead Channel Impedance Value: 418 Ohm
Lead Channel Impedance Value: 437 Ohm
Lead Channel Pacing Threshold Amplitude: 0.625 V
Lead Channel Pacing Threshold Amplitude: 0.75 V
Lead Channel Pacing Threshold Pulse Width: 0.4 ms
Lead Channel Pacing Threshold Pulse Width: 0.4 ms
Lead Channel Sensing Intrinsic Amplitude: 2.125 mV
Lead Channel Sensing Intrinsic Amplitude: 2.125 mV
Lead Channel Sensing Intrinsic Amplitude: 6.75 mV
Lead Channel Sensing Intrinsic Amplitude: 6.75 mV
Lead Channel Setting Pacing Amplitude: 2 V
Lead Channel Setting Pacing Amplitude: 2.5 V
Lead Channel Setting Pacing Pulse Width: 0.4 ms
Lead Channel Setting Sensing Sensitivity: 2.8 mV
Zone Setting Status: 755011
Zone Setting Status: 755011

## 2023-10-03 ENCOUNTER — Ambulatory Visit (HOSPITAL_COMMUNITY): Payer: Medicare Other | Attending: Internal Medicine

## 2023-10-03 DIAGNOSIS — R0609 Other forms of dyspnea: Secondary | ICD-10-CM | POA: Diagnosis present

## 2023-10-03 LAB — ECHOCARDIOGRAM COMPLETE
Area-P 1/2: 3.24 cm2
P 1/2 time: 479 ms
S' Lateral: 2.9 cm

## 2023-10-08 ENCOUNTER — Encounter: Payer: Self-pay | Admitting: Internal Medicine

## 2023-10-21 NOTE — Addendum Note (Signed)
Addended by: Elease Etienne A on: 10/21/2023 08:52 AM   Modules accepted: Orders

## 2023-10-21 NOTE — Progress Notes (Signed)
Remote pacemaker transmission.   

## 2023-11-21 ENCOUNTER — Encounter: Payer: Self-pay | Admitting: Psychology

## 2023-11-21 ENCOUNTER — Encounter: Payer: Medicare Other | Attending: Psychology | Admitting: Psychology

## 2023-11-21 DIAGNOSIS — I679 Cerebrovascular disease, unspecified: Secondary | ICD-10-CM | POA: Diagnosis present

## 2023-11-21 DIAGNOSIS — G4733 Obstructive sleep apnea (adult) (pediatric): Secondary | ICD-10-CM | POA: Diagnosis present

## 2023-11-21 DIAGNOSIS — Z87820 Personal history of traumatic brain injury: Secondary | ICD-10-CM | POA: Diagnosis present

## 2023-11-21 DIAGNOSIS — R413 Other amnesia: Secondary | ICD-10-CM | POA: Diagnosis present

## 2023-11-21 NOTE — Progress Notes (Signed)
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 finding a very mild slowing or changes in information processing speed these objective capacities have not changed over the past year.  It does appear that the patient has benefited greatly from his consistent use of CPAP in my previous concern around untreated unmanaged obstructive sleep apnea as one of the contributing factors to his difficulties would be consistent in these observed improvements in many cognitive domains once consistent CPAP use is applied.  The patient is showing no patterns consistent with degenerative major neurocognitive disorder type patterns such as Alzheimer's, Lewy body or other degenerative conditions and it appears that his current difficulties range from residual effects of his TBI, normal age-related changes and increased anxiety and vigilance for any mistakes that are made.  The patient is showing some worsening related to visual encoding which will need to be monitored.  While the patient has  had to get assistance and double checking where he is going which is impacted driving he has had no accidents or other issues driving.  However, this vigilance and concerned about potentially making a mistake as potentially going to have an impact on the efficiency and accuracy when driving.  I will review this issue with the patient and specific recommendations for him.  While using GPS as a memory aid and reinforcer can be helpful in some ways it also poses a risk of distraction from the actual process of driving and observing the road and other objects specifically.  Diagnosis:    Memory loss  Small vessel disease, cerebrovascular  OSA (obstructive sleep apnea)  History of traumatic brain injury   _____________________ Arley Phenix, Psy.D. Clinical Neuropsychologist

## 2023-11-25 ENCOUNTER — Encounter: Payer: Medicare Other | Admitting: Psychology

## 2023-11-25 DIAGNOSIS — Z87820 Personal history of traumatic brain injury: Secondary | ICD-10-CM | POA: Diagnosis not present

## 2023-11-25 DIAGNOSIS — R413 Other amnesia: Secondary | ICD-10-CM | POA: Diagnosis not present

## 2023-11-25 DIAGNOSIS — I679 Cerebrovascular disease, unspecified: Secondary | ICD-10-CM

## 2023-11-25 DIAGNOSIS — G4733 Obstructive sleep apnea (adult) (pediatric): Secondary | ICD-10-CM

## 2023-12-10 ENCOUNTER — Ambulatory Visit: Payer: Medicare Other

## 2023-12-10 DIAGNOSIS — I442 Atrioventricular block, complete: Secondary | ICD-10-CM

## 2023-12-10 LAB — CUP PACEART REMOTE DEVICE CHECK
Battery Remaining Longevity: 31 mo
Battery Voltage: 2.95 V
Brady Statistic AP VP Percent: 35.87 %
Brady Statistic AP VS Percent: 56.23 %
Brady Statistic AS VP Percent: 0 %
Brady Statistic AS VS Percent: 7.9 %
Brady Statistic RA Percent Paced: 89.43 %
Brady Statistic RV Percent Paced: 34.51 %
Date Time Interrogation Session: 20250311081715
Implantable Lead Connection Status: 753985
Implantable Lead Connection Status: 753985
Implantable Lead Implant Date: 20171215
Implantable Lead Implant Date: 20171215
Implantable Lead Location: 753859
Implantable Lead Location: 753860
Implantable Lead Model: 5076
Implantable Lead Model: 5076
Implantable Pulse Generator Implant Date: 20171215
Lead Channel Impedance Value: 342 Ohm
Lead Channel Impedance Value: 361 Ohm
Lead Channel Impedance Value: 418 Ohm
Lead Channel Impedance Value: 418 Ohm
Lead Channel Pacing Threshold Amplitude: 0.5 V
Lead Channel Pacing Threshold Amplitude: 0.875 V
Lead Channel Pacing Threshold Pulse Width: 0.4 ms
Lead Channel Pacing Threshold Pulse Width: 0.4 ms
Lead Channel Sensing Intrinsic Amplitude: 2 mV
Lead Channel Sensing Intrinsic Amplitude: 2 mV
Lead Channel Sensing Intrinsic Amplitude: 6.625 mV
Lead Channel Sensing Intrinsic Amplitude: 6.625 mV
Lead Channel Setting Pacing Amplitude: 2 V
Lead Channel Setting Pacing Amplitude: 2.5 V
Lead Channel Setting Pacing Pulse Width: 0.4 ms
Lead Channel Setting Sensing Sensitivity: 2.8 mV
Zone Setting Status: 755011
Zone Setting Status: 755011

## 2023-12-11 ENCOUNTER — Encounter: Payer: Self-pay | Admitting: Internal Medicine

## 2024-01-15 ENCOUNTER — Encounter: Payer: Self-pay | Admitting: Psychology

## 2024-01-15 ENCOUNTER — Ambulatory Visit (INDEPENDENT_AMBULATORY_CARE_PROVIDER_SITE_OTHER)

## 2024-01-15 ENCOUNTER — Ambulatory Visit: Admitting: Podiatry

## 2024-01-15 ENCOUNTER — Encounter: Payer: Self-pay | Admitting: Podiatry

## 2024-01-15 DIAGNOSIS — M205X2 Other deformities of toe(s) (acquired), left foot: Secondary | ICD-10-CM

## 2024-01-15 DIAGNOSIS — M7752 Other enthesopathy of left foot: Secondary | ICD-10-CM

## 2024-01-15 MED ORDER — TRIAMCINOLONE ACETONIDE 10 MG/ML IJ SUSP
10.0000 mg | Freq: Once | INTRAMUSCULAR | Status: AC
  Administered 2024-01-15: 10 mg via INTRA_ARTICULAR

## 2024-01-15 NOTE — Progress Notes (Signed)
 Subjective:   Patient ID: Marvin Padilla, male   DOB: 83 y.o.   MRN: [id number]   HPI Patient states he is developing a lot of pain in his big toe joint of the left and knows that he does have arthritis.  States the other probably worked out he is doing well   ROS      Objective:  Physical Exam  Neuro vascular status intact with significant discomfort first MPJ left fluid buildup around the joint surface painful when pressed     Assessment:  Inflammatory capsulitis of the first MPJ left with fluid buildup around the joint surface     Plan:  H&P reviewed hallux limitus and x-rays today.  Sterile prep injected the first MPJ periarticular 3 mg Kenalog 5 mg Xylocaine and advised on anti-inflammatories rigid bottom shoes and may require surgery with probable implant or other procedure at that time  X-rays indicate that there is a large bone spur around the first MPJ left with narrowing of the joint surface of a significant nature
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
## 2024-01-28 NOTE — Progress Notes (Signed)
 Remote pacemaker transmission.

## 2024-03-10 ENCOUNTER — Ambulatory Visit (INDEPENDENT_AMBULATORY_CARE_PROVIDER_SITE_OTHER): Payer: Medicare Other

## 2024-03-10 DIAGNOSIS — I442 Atrioventricular block, complete: Secondary | ICD-10-CM

## 2024-03-11 LAB — CUP PACEART REMOTE DEVICE CHECK
Battery Remaining Longevity: 27 mo
Battery Voltage: 2.94 V
Brady Statistic AP VP Percent: 46.11 %
Brady Statistic AP VS Percent: 31.38 %
Brady Statistic AS VP Percent: 13.66 %
Brady Statistic AS VS Percent: 8.84 %
Brady Statistic RA Percent Paced: 75.21 %
Brady Statistic RV Percent Paced: 58.58 %
Date Time Interrogation Session: 20250610081825
Implantable Lead Connection Status: 753985
Implantable Lead Connection Status: 753985
Implantable Lead Implant Date: 20171215
Implantable Lead Implant Date: 20171215
Implantable Lead Location: 753859
Implantable Lead Location: 753860
Implantable Lead Model: 5076
Implantable Lead Model: 5076
Implantable Pulse Generator Implant Date: 20171215
Lead Channel Impedance Value: 342 Ohm
Lead Channel Impedance Value: 361 Ohm
Lead Channel Impedance Value: 418 Ohm
Lead Channel Impedance Value: 418 Ohm
Lead Channel Pacing Threshold Amplitude: 0.625 V
Lead Channel Pacing Threshold Amplitude: 0.875 V
Lead Channel Pacing Threshold Pulse Width: 0.4 ms
Lead Channel Pacing Threshold Pulse Width: 0.4 ms
Lead Channel Sensing Intrinsic Amplitude: 2.25 mV
Lead Channel Sensing Intrinsic Amplitude: 2.25 mV
Lead Channel Sensing Intrinsic Amplitude: 7 mV
Lead Channel Sensing Intrinsic Amplitude: 7 mV
Lead Channel Setting Pacing Amplitude: 2 V
Lead Channel Setting Pacing Amplitude: 2.5 V
Lead Channel Setting Pacing Pulse Width: 0.4 ms
Lead Channel Setting Sensing Sensitivity: 2.8 mV
Zone Setting Status: 755011
Zone Setting Status: 755011

## 2024-03-15 ENCOUNTER — Ambulatory Visit: Payer: Self-pay | Admitting: Internal Medicine

## 2024-03-18 NOTE — Patient Instructions (Incomplete)
Please continue using your CPAP regularly. While your insurance requires that you use CPAP at least 4 hours each night on 70% of the nights, I recommend, that you not skip any nights and use it throughout the night if you can. Getting used to CPAP and staying with the treatment long term does take time and patience and discipline. Untreated obstructive sleep apnea when it is moderate to severe can have an adverse impact on cardiovascular health and raise her risk for heart disease, arrhythmias, hypertension, congestive heart failure, stroke and diabetes. Untreated obstructive sleep apnea causes sleep disruption, nonrestorative sleep, and sleep deprivation. This can have an impact on your day to day functioning and cause daytime sleepiness and impairment of cognitive function, memory loss, mood disturbance, and problems focussing. Using CPAP regularly can improve these symptoms.  We will update supply orders, today.   Follow up in 1 year   Management of Memory Problems   There are some general things you can do to help manage your memory problems.  Your memory may not in fact recover, but by using techniques and strategies you will be able to manage your memory difficulties better.   1)  Establish a routine. Try to establish and then stick to a regular routine.  By doing this, you will get used to what to expect and you will reduce the need to rely on your memory.  Also, try to do things at the same time of day, such as taking your medication or checking your calendar first thing in the morning. Think about think that you can do as a part of a regular routine and make a list.  Then enter them into a daily planner to remind you.  This will help you establish a routine.   2)  Organize your environment. Organize your environment so that it is uncluttered.  Decrease visual stimulation.  Place everyday items such as keys or cell phone in the same place every day (ie.  Basket next to front door) Use post it  notes with a brief message to yourself (ie. Turn off light, lock the door) Use labels to indicate where things go (ie. Which cupboards are for food, dishes, etc.) Keep a notepad and pen by the telephone to take messages   3)  Memory Aids A diary or journal/notebook/daily planner Making a list (shopping list, chore list, to do list that needs to be done) Using an alarm as a reminder (kitchen timer or cell phone alarm) Using cell phone to store information (Notes, Calendar, Reminders) Calendar/White board placed in a prominent position Post-it notes   In order for memory aids to be useful, you need to have good habits.  It's no good remembering to make a note in your journal if you don't remember to look in it.  Try setting aside a certain time of day to look in journal.   4)  Improving mood and managing fatigue. There may be other factors that contribute to memory difficulties.  Factors, such as anxiety, depression and tiredness can affect memory. Regular gentle exercise can help improve your mood and give you more energy. Exercise: there are short videos created by the [employer] on Health specially for older adults: https://bit.ly/2I30q97.  Mediterranean diet: which emphasizes fruits, vegetables, whole grains, legumes, fish, and other seafood; unsaturated fats such as olive oils; and low amounts of red meat, eggs, and sweets. A variation of this, called MIND [facility] Intervention for Neurodegenerative Delay) incorporates the DASH (Dietary Approaches to Stop  Hypertension) diet, which has been shown to lower high blood pressure, a risk factor for Alzheimer's disease. More information at: ExitMarketing.de.  Aerobic exercise that improve heart health is also good for the mind.  [employer] on Aging have short videos for exercises that you can do at home: BlindWorkshop.com.pt Simple relaxation  techniques may help relieve symptoms of anxiety Try to get back to completing activities or hobbies you enjoyed doing in the past. Learn to pace yourself through activities to decrease fatigue. Find out about some local support groups where you can share experiences with others. Try and achieve 7-8 hours of sleep at night.   Tasks to improve attention/working memory 1. Good sleep hygiene (7-8 hrs of sleep) 2. Learning a new skill (Painting, Carpentry, Pottery, new language, Knitting). 3.Cognitive exercises (keep a daily journal, Puzzles) 4. Physical exercise and training  (30 min/day X 4 days week) 5. Being on Antidepressant if needed 6.Yoga, Meditation, Tai Chi 7. Decrease alcohol intake 8.Have a clear schedule and structure in daily routine   MIND Diet: The Mediterranean-DASH Diet Intervention for Neurodegenerative Delay, or MIND diet, targets the health of the aging brain. Research participants with the highest MIND diet scores had a significantly slower rate of cognitive decline compared with those with the lowest scores. The effects of the MIND diet on cognition showed greater effects than either the Mediterranean or the DASH diet alone.   The healthy items the MIND diet guidelines suggest include:   3+ servings a day of whole grains 1+ servings a day of vegetables (other than green leafy) 6+ servings a week of green leafy vegetables 5+ servings a week of nuts 4+ meals a week of beans 2+ servings a week of berries 2+ meals a week of poultry 1+ meals a week of fish Mainly olive oil if added fat is used   The unhealthy items, which are higher in saturated and trans fat, include: Less than 5 servings a week of pastries and sweets Less than 4 servings a week of red meat (including beef, pork, lamb, and products made from these meats) Less than one serving a week of cheese and fried foods Less than 1 tablespoon a day of butter/stick margarine
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
## 2024-03-23 NOTE — Progress Notes (Unsigned)
 SABRA

## 2024-03-24 ENCOUNTER — Ambulatory Visit: Payer: Medicare Other | Admitting: Family Medicine

## 2024-03-24 ENCOUNTER — Encounter: Payer: Self-pay | Admitting: Family Medicine

## 2024-03-24 VITALS — BP 152/74 | HR 63 | Ht 70.0 in | Wt 212.6 lb

## 2024-03-24 DIAGNOSIS — R413 Other amnesia: Secondary | ICD-10-CM

## 2024-03-24 DIAGNOSIS — G4733 Obstructive sleep apnea (adult) (pediatric): Secondary | ICD-10-CM

## 2024-05-12 ENCOUNTER — Telehealth: Payer: Self-pay | Admitting: Family Medicine

## 2024-05-12 NOTE — Telephone Encounter (Signed)
 Adrien, can you help with this? Amy placed order 03/24/24. Thank you

## 2024-05-12 NOTE — Telephone Encounter (Signed)
 Camellie from [employer] CALLED TO REQUEST pt order for Cpap Supplies be faxed over with MD signature on it   [phone],  Fax: [phone]

## 2024-05-12 NOTE — Telephone Encounter (Signed)
 Printed off order, Getting NP to sign, Will fax

## 2024-05-14 NOTE — Progress Notes (Signed)
 Remote pacemaker transmission.

## 2024-05-14 NOTE — Addendum Note (Signed)
 Addended by: VICCI SELLER A on: 05/14/2024 02:01 PM   Modules accepted: Orders

## 2024-05-26 NOTE — Telephone Encounter (Signed)
 Marvin Padilla from [facility] called stated never received updated order. Needs detailed written order with signature faxed with updated office notes to [phone]. Adrien is out today, I checked her office but unable to find this signed order that was sent previously-is this able to be printed again? I will fax it out. Thank you

## 2024-05-26 NOTE — Telephone Encounter (Signed)
 FYI

## 2024-05-26 NOTE — Telephone Encounter (Signed)
 Printed order again and had Amy sign. Faxed this and office note to fax# below. Received fax confirmation.

## 2024-05-28 ENCOUNTER — Encounter: Payer: Self-pay | Admitting: Family Medicine

## 2024-05-28 NOTE — Telephone Encounter (Signed)
 Called [facility] at [phone].  Spoke w/ Karna who confirmed they did not receive faxes from 05/12/24 or 05/26/24. I asked this be expedited since we have faxed twice and they are not receiving. She asked I email orders/notes to: [email or web address]. I sent orders via email. Asked they confirm receipt.

## 2024-06-09 ENCOUNTER — Ambulatory Visit

## 2024-06-09 DIAGNOSIS — I442 Atrioventricular block, complete: Secondary | ICD-10-CM

## 2024-06-10 LAB — CUP PACEART REMOTE DEVICE CHECK
Battery Remaining Longevity: 27 mo
Battery Voltage: 2.93 V
Brady Statistic AP VP Percent: 61.99 %
Brady Statistic AP VS Percent: 18.22 %
Brady Statistic AS VP Percent: 13.87 %
Brady Statistic AS VS Percent: 5.91 %
Brady Statistic RA Percent Paced: 78.51 %
Brady Statistic RV Percent Paced: 74.66 %
Date Time Interrogation Session: 20250909082909
Implantable Lead Connection Status: 753985
Implantable Lead Connection Status: 753985
Implantable Lead Implant Date: 20171215
Implantable Lead Implant Date: 20171215
Implantable Lead Location: 753859
Implantable Lead Location: 753860
Implantable Lead Model: 5076
Implantable Lead Model: 5076
Implantable Pulse Generator Implant Date: 20171215
Lead Channel Impedance Value: 342 Ohm
Lead Channel Impedance Value: 361 Ohm
Lead Channel Impedance Value: 418 Ohm
Lead Channel Impedance Value: 437 Ohm
Lead Channel Pacing Threshold Amplitude: 0.625 V
Lead Channel Pacing Threshold Amplitude: 0.875 V
Lead Channel Pacing Threshold Pulse Width: 0.4 ms
Lead Channel Pacing Threshold Pulse Width: 0.4 ms
Lead Channel Sensing Intrinsic Amplitude: 1.875 mV
Lead Channel Sensing Intrinsic Amplitude: 1.875 mV
Lead Channel Sensing Intrinsic Amplitude: 5.875 mV
Lead Channel Sensing Intrinsic Amplitude: 5.875 mV
Lead Channel Setting Pacing Amplitude: 2 V
Lead Channel Setting Pacing Amplitude: 2.5 V
Lead Channel Setting Pacing Pulse Width: 0.4 ms
Lead Channel Setting Sensing Sensitivity: 2.8 mV
Zone Setting Status: 755011
Zone Setting Status: 755011

## 2024-06-16 ENCOUNTER — Ambulatory Visit: Payer: Self-pay | Admitting: Internal Medicine

## 2024-06-19 NOTE — Progress Notes (Signed)
 Remote PPM Transmission

## 2024-07-01 ENCOUNTER — Encounter: Payer: Self-pay | Admitting: Podiatry

## 2024-07-01 ENCOUNTER — Ambulatory Visit (INDEPENDENT_AMBULATORY_CARE_PROVIDER_SITE_OTHER): Admitting: Podiatry

## 2024-07-01 DIAGNOSIS — M7752 Other enthesopathy of left foot: Secondary | ICD-10-CM

## 2024-07-01 DIAGNOSIS — M109 Gout, unspecified: Secondary | ICD-10-CM | POA: Diagnosis not present

## 2024-07-01 MED ORDER — TRIAMCINOLONE ACETONIDE 10 MG/ML IJ SUSP
10.0000 mg | Freq: Once | INTRAMUSCULAR | Status: AC
  Administered 2024-07-01: 10 mg via INTRA_ARTICULAR

## 2024-07-01 NOTE — Progress Notes (Signed)
 Subjective:   Patient ID: Marvin Padilla, male   DOB: 83 y.o.   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  For the gout I discussed being careful with meats and shellfish and if it were to occur again I want to see him right away we will order blood work and we will follow whether or not there may be gout symptomatology and for the joint inflammation I did do sterile prep I injected around the joint 3 mg dexamethasone  Kenalog  5 mg Xylocaine  applied sterile dressing

## 2024-08-24 ENCOUNTER — Encounter: Payer: Self-pay | Admitting: Internal Medicine

## 2024-08-24 ENCOUNTER — Ambulatory Visit: Attending: Internal Medicine | Admitting: Internal Medicine

## 2024-08-24 VITALS — BP 152/70 | HR 77 | Ht 70.0 in | Wt 215.0 lb

## 2024-08-24 DIAGNOSIS — I10 Essential (primary) hypertension: Secondary | ICD-10-CM

## 2024-08-24 DIAGNOSIS — E7800 Pure hypercholesterolemia, unspecified: Secondary | ICD-10-CM | POA: Diagnosis not present

## 2024-08-24 DIAGNOSIS — Z950 Presence of cardiac pacemaker: Secondary | ICD-10-CM

## 2024-08-24 LAB — CUP PACEART INCLINIC DEVICE CHECK
Date Time Interrogation Session: 20251124093538
Implantable Lead Connection Status: 753985
Implantable Lead Connection Status: 753985
Implantable Lead Implant Date: 20171215
Implantable Lead Implant Date: 20171215
Implantable Lead Location: 753859
Implantable Lead Location: 753860
Implantable Lead Model: 5076
Implantable Lead Model: 5076
Implantable Pulse Generator Implant Date: 20171215

## 2024-08-24 NOTE — Patient Instructions (Signed)
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
## 2024-09-08 ENCOUNTER — Ambulatory Visit

## 2024-09-10 ENCOUNTER — Ambulatory Visit: Payer: Self-pay | Admitting: Internal Medicine

## 2024-09-10 LAB — CUP PACEART REMOTE DEVICE CHECK
Battery Remaining Longevity: 20 mo
Battery Voltage: 2.92 V
Brady Statistic AP VP Percent: 78.58 %
Brady Statistic AP VS Percent: 0.16 %
Brady Statistic AS VP Percent: 21.16 %
Brady Statistic AS VS Percent: 0.11 %
Brady Statistic RA Percent Paced: 76.14 %
Brady Statistic RV Percent Paced: 95.86 %
Date Time Interrogation Session: 20251209103409
Implantable Lead Connection Status: 753985
Implantable Lead Connection Status: 753985
Implantable Lead Implant Date: 20171215
Implantable Lead Implant Date: 20171215
Implantable Lead Location: 753859
Implantable Lead Location: 753860
Implantable Lead Model: 5076
Implantable Lead Model: 5076
Implantable Pulse Generator Implant Date: 20171215
Lead Channel Impedance Value: 342 Ohm
Lead Channel Impedance Value: 380 Ohm
Lead Channel Impedance Value: 418 Ohm
Lead Channel Impedance Value: 418 Ohm
Lead Channel Pacing Threshold Amplitude: 0.625 V
Lead Channel Pacing Threshold Amplitude: 0.75 V
Lead Channel Pacing Threshold Pulse Width: 0.4 ms
Lead Channel Pacing Threshold Pulse Width: 0.4 ms
Lead Channel Sensing Intrinsic Amplitude: 2 mV
Lead Channel Sensing Intrinsic Amplitude: 2 mV
Lead Channel Sensing Intrinsic Amplitude: 9.125 mV
Lead Channel Sensing Intrinsic Amplitude: 9.125 mV
Lead Channel Setting Pacing Amplitude: 2 V
Lead Channel Setting Pacing Amplitude: 2.5 V
Lead Channel Setting Pacing Pulse Width: 0.4 ms
Lead Channel Setting Sensing Sensitivity: 2.8 mV
Zone Setting Status: 755011
Zone Setting Status: 755011

## 2024-09-16 NOTE — Progress Notes (Signed)
 Remote PPM Transmission

## 2024-12-08 ENCOUNTER — Encounter

## 2024-12-30 ENCOUNTER — Ambulatory Visit: Admitting: Podiatry

## 2025-03-09 ENCOUNTER — Encounter

## 2025-03-24 ENCOUNTER — Ambulatory Visit: Admitting: Family Medicine

## 2025-03-29 ENCOUNTER — Ambulatory Visit: Admitting: Family Medicine

## 2025-06-08 ENCOUNTER — Encounter

## 2025-09-07 ENCOUNTER — Encounter
